# Patient Record
Sex: Female | Born: 1960 | Race: Black or African American | Hispanic: No | Marital: Married | Smoking: Never smoker
Health system: Southern US, Community
[De-identification: ages and names within clinical notes are randomized; demographics above are authoritative.]

## PROBLEM LIST (undated history)

## (undated) DIAGNOSIS — J301 Allergic rhinitis due to pollen: Secondary | ICD-10-CM

## (undated) DIAGNOSIS — R0600 Dyspnea, unspecified: Secondary | ICD-10-CM

## (undated) DIAGNOSIS — T7840XA Allergy, unspecified, initial encounter: Secondary | ICD-10-CM

## (undated) DIAGNOSIS — K219 Gastro-esophageal reflux disease without esophagitis: Secondary | ICD-10-CM

## (undated) DIAGNOSIS — M1990 Unspecified osteoarthritis, unspecified site: Secondary | ICD-10-CM

## (undated) DIAGNOSIS — D649 Anemia, unspecified: Secondary | ICD-10-CM

## (undated) DIAGNOSIS — J45909 Unspecified asthma, uncomplicated: Secondary | ICD-10-CM

## (undated) DIAGNOSIS — R7303 Prediabetes: Secondary | ICD-10-CM

## (undated) DIAGNOSIS — I209 Angina pectoris, unspecified: Secondary | ICD-10-CM

## (undated) HISTORY — DX: Gastro-esophageal reflux disease without esophagitis: K21.9

## (undated) HISTORY — DX: Allergic rhinitis due to pollen: J30.1

## (undated) HISTORY — DX: Allergy, unspecified, initial encounter: T78.40XA

## (undated) HISTORY — PX: NO PAST SURGERIES: SHX2092

## (undated) HISTORY — DX: Anemia, unspecified: D64.9

## (undated) SURGERY — Surgical Case
Anesthesia: *Unknown

---

## 2015-07-19 ENCOUNTER — Emergency Department (HOSPITAL_COMMUNITY)

## 2015-07-19 ENCOUNTER — Encounter (HOSPITAL_COMMUNITY): Payer: Self-pay | Admitting: Family Medicine

## 2015-07-19 ENCOUNTER — Emergency Department (HOSPITAL_COMMUNITY)
Admission: EM | Admit: 2015-07-19 | Discharge: 2015-07-19 | Disposition: A | Discharge status: Home / Self Care | Attending: Emergency Medicine | Admitting: Emergency Medicine

## 2015-07-19 DIAGNOSIS — R42 Dizziness and giddiness: Secondary | ICD-10-CM | POA: Insufficient documentation

## 2015-07-19 DIAGNOSIS — X58XXXA Exposure to other specified factors, initial encounter: Secondary | ICD-10-CM | POA: Insufficient documentation

## 2015-07-19 DIAGNOSIS — Z91040 Latex allergy status: Secondary | ICD-10-CM | POA: Insufficient documentation

## 2015-07-19 DIAGNOSIS — J45909 Unspecified asthma, uncomplicated: Secondary | ICD-10-CM | POA: Insufficient documentation

## 2015-07-19 DIAGNOSIS — S43142A Inferior dislocation of left acromioclavicular joint, initial encounter: Secondary | ICD-10-CM | POA: Insufficient documentation

## 2015-07-19 DIAGNOSIS — Y9389 Activity, other specified: Secondary | ICD-10-CM | POA: Insufficient documentation

## 2015-07-19 DIAGNOSIS — M7522 Bicipital tendinitis, left shoulder: Secondary | ICD-10-CM | POA: Insufficient documentation

## 2015-07-19 DIAGNOSIS — Y9289 Other specified places as the place of occurrence of the external cause: Secondary | ICD-10-CM | POA: Insufficient documentation

## 2015-07-19 DIAGNOSIS — S43102A Unspecified dislocation of left acromioclavicular joint, initial encounter: Secondary | ICD-10-CM

## 2015-07-19 DIAGNOSIS — Z8719 Personal history of other diseases of the digestive system: Secondary | ICD-10-CM | POA: Insufficient documentation

## 2015-07-19 DIAGNOSIS — M25512 Pain in left shoulder: Secondary | ICD-10-CM

## 2015-07-19 DIAGNOSIS — Y999 Unspecified external cause status: Secondary | ICD-10-CM | POA: Insufficient documentation

## 2015-07-19 DIAGNOSIS — Z862 Personal history of diseases of the blood and blood-forming organs and certain disorders involving the immune mechanism: Secondary | ICD-10-CM | POA: Diagnosis not present

## 2015-07-19 DIAGNOSIS — R635 Abnormal weight gain: Secondary | ICD-10-CM | POA: Diagnosis not present

## 2015-07-19 DIAGNOSIS — Z3202 Encounter for pregnancy test, result negative: Secondary | ICD-10-CM | POA: Insufficient documentation

## 2015-07-19 DIAGNOSIS — N951 Menopausal and female climacteric states: Secondary | ICD-10-CM

## 2015-07-19 DIAGNOSIS — N958 Other specified menopausal and perimenopausal disorders: Secondary | ICD-10-CM | POA: Insufficient documentation

## 2015-07-19 DIAGNOSIS — R232 Flushing: Secondary | ICD-10-CM

## 2015-07-19 HISTORY — DX: Unspecified asthma, uncomplicated: J45.909

## 2015-07-19 LAB — URINALYSIS, ROUTINE W REFLEX MICROSCOPIC
BILIRUBIN URINE: NEGATIVE
GLUCOSE, UA: NEGATIVE mg/dL
Hgb urine dipstick: NEGATIVE
KETONES UR: NEGATIVE mg/dL
NITRITE: NEGATIVE
PH: 6.5 (ref 5.0–8.0)
Protein, ur: NEGATIVE mg/dL
Specific Gravity, Urine: 1.024 (ref 1.005–1.030)

## 2015-07-19 LAB — URINE MICROSCOPIC-ADD ON: RBC / HPF: NONE SEEN RBC/hpf (ref 0–5)

## 2015-07-19 LAB — CBC
HCT: 38.1 % (ref 36.0–46.0)
Hemoglobin: 12 g/dL (ref 12.0–15.0)
MCH: 25.1 pg — AB (ref 26.0–34.0)
MCHC: 31.5 g/dL (ref 30.0–36.0)
MCV: 79.7 fL (ref 78.0–100.0)
PLATELETS: 365 10*3/uL (ref 150–400)
RBC: 4.78 MIL/uL (ref 3.87–5.11)
RDW: 16.9 % — ABNORMAL HIGH (ref 11.5–15.5)
WBC: 9.3 10*3/uL (ref 4.0–10.5)

## 2015-07-19 LAB — POC URINE PREG, ED: Preg Test, Ur: NEGATIVE

## 2015-07-19 LAB — BASIC METABOLIC PANEL
Anion gap: 8 (ref 5–15)
BUN: 15 mg/dL (ref 6–20)
CALCIUM: 9.7 mg/dL (ref 8.9–10.3)
CO2: 27 mmol/L (ref 22–32)
CREATININE: 0.96 mg/dL (ref 0.44–1.00)
Chloride: 106 mmol/L (ref 101–111)
GFR calc non Af Amer: >60 mL/min (ref >60)
Glucose, Bld: 121 mg/dL — ABNORMAL HIGH (ref 65–99)
Potassium: 3.7 mmol/L (ref 3.5–5.1)
SODIUM: 141 mmol/L (ref 135–145)

## 2015-07-19 LAB — I-STAT TROPONIN, ED: TROPONIN I, POC: 0 ng/mL (ref 0.00–0.08)

## 2015-07-19 MED ORDER — HYDROCODONE-ACETAMINOPHEN 5-325 MG PO TABS: 1.0000 | ORAL_TABLET | Freq: Four times a day (QID) | ORAL | Status: DC | PRN

## 2015-07-19 MED ORDER — MELOXICAM 15 MG PO TABS: 15.0000 mg | ORAL_TABLET | Freq: Every day | ORAL | Status: DC

## 2015-07-19 MED ORDER — KETOROLAC TROMETHAMINE 30 MG/ML IJ SOLN
30.0000 mg | Freq: Once | INTRAMUSCULAR | Status: AC
  Administered 2015-07-19: 30 mg via INTRAMUSCULAR
  Filled 2015-07-19: qty 1

## 2015-07-19 NOTE — ED Provider Notes (Signed)
CSN: [id number]     Arrival date & time 07/19/15  1823 History   First MD Initiated Contact with Patient 07/19/15 2100     Chief Complaint  Patient presents with  . Arm Pain  . Dizziness     (Consider location/radiation/quality/duration/timing/severity/associated sxs/prior Treatment) HPI Comments: Lisa Galloway is a 55 y.o. female with a PMHx of asthma, GERD, and anemia, who presents to the ED with multiple complaints. Her primary complaint is episodes of lightheadedness when she stands which occur intermittently, are brief lasting only approximately 15 seconds or less, improved with sitting down and resting, and with no other treatments tried prior to arrival. They've been ongoing for approximately 3 weeks. She also complains of left upper arm pain 3 weeks, describing it as 9/10 aching in the bicep region, intermittent, radiating down her arm, worse with movement and sleeping on her left side, and improved mildly with Aleve, tramadol, Vicodin, ibuprofen, and Tylenol. Lastly she is concerned because she has gained 35 pounds in the last 4 months and she is not sure why. She has been having hot flashes and has not had a menstrual cycle in 4 months, she's not sure whether she may be going through menopause. Additionally she reports intermittent nausea which is currently resolved. She does not have a regular doctor. She is left-handed and states that she does some repetitive motions with her left arm. She denies any injuries to the left arm.  She denies any fevers, chills, vision changes, headache, syncope, vertiginous dizziness, chest pain, shortness of breath, abdominal pain, vomiting, diarrhea, constipation, melena, hematochezia, dysuria, hematuria, vaginal bleeding or discharge, diaphoresis, numbness, tingling, weakness, leg swelling, personal or family history of DVT/PE, skin changes to the left arm, joint swelling, estrogen use, recent travel/surgery/immobilization, or any other associated  symptoms. She is a nonsmoker.   Patient is a 55 y.o. female presenting with arm pain and dizziness. The history is provided by the patient. No language interpreter was used.  Arm Pain This is a new problem. The current episode started 1 to 4 weeks ago. The problem occurs intermittently. The problem has been unchanged. Associated symptoms include arthralgias (L shoulder/arm). Pertinent negatives include no abdominal pain, chest pain, chills, diaphoresis, fever, headaches, joint swelling, myalgias, nausea (intermittent, currently resolved), neck pain, numbness, urinary symptoms, vertigo, visual change, vomiting or weakness. Exacerbated by: arm movement and sleeping on it. She has tried acetaminophen, NSAIDs and oral narcotics for the symptoms. The treatment provided mild relief.  Dizziness Associated symptoms: no blood in stool, no chest pain, no diarrhea, no headaches, no nausea (intermittent, currently resolved), no shortness of breath, no vision changes, no vomiting and no weakness     Past Medical History  Diagnosis Date  . Asthma    History reviewed. No pertinent past surgical history. History reviewed. No pertinent family history. Social History  Substance Use Topics  . Smoking status: Never Smoker   . Smokeless tobacco: None  . Alcohol Use: No   OB History    No data available     Review of Systems  Constitutional: Positive for unexpected weight change (35lb gain in 4 months). Negative for fever, chills and diaphoresis.  Eyes: Negative for visual disturbance.  Respiratory: Negative for shortness of breath.   Cardiovascular: Negative for chest pain and leg swelling.  Gastrointestinal: Negative for nausea (intermittent, currently resolved), vomiting, abdominal pain, diarrhea, constipation and blood in stool.  Genitourinary: Positive for menstrual problem (no menses in 4 months). Negative for dysuria, hematuria, vaginal bleeding  and vaginal discharge.  Musculoskeletal: Positive for  arthralgias (L shoulder/arm). Negative for myalgias, joint swelling and neck pain.  Skin: Negative for color change.  Allergic/Immunologic: Negative for immunocompromised state.  Neurological: Positive for dizziness and light-headedness. Negative for vertigo, syncope, weakness, numbness and headaches.  Psychiatric/Behavioral: Negative for confusion.   10 Systems reviewed and are negative for acute change except as noted in the HPI.    Allergies  Ginger; Latex; and Oxycodone  Home Medications   Prior to Admission medications   Not on File   BP 125/89 mmHg  Pulse 95  Temp(Src) 98.2 F (36.8 C) (Oral)  Resp 18  Wt 81.647 kg  SpO2 100% Physical Exam  Constitutional: She is oriented to person, place, and time. Vital signs are normal. She appears well-developed and well-nourished.  Non-toxic appearance. No distress.  Afebrile, nontoxic, NAD  HENT:  Head: Normocephalic and atraumatic.  Mouth/Throat: Oropharynx is clear and moist and mucous membranes are normal.  Eyes: Conjunctivae and EOM are normal. Pupils are equal, round, and reactive to light. Right eye exhibits no discharge. Left eye exhibits no discharge.  PERRL, EOMI, no nystagmus, no visual field deficits   Neck: Normal range of motion. Neck supple. No spinous process tenderness and no muscular tenderness present. No rigidity. Normal range of motion present.  FROM intact without spinous process TTP, no bony stepoffs or deformities, no paraspinous muscle TTP or muscle spasms. No rigidity or meningeal signs. No bruising or swelling.   Cardiovascular: Normal rate, regular rhythm, normal heart sounds and intact distal pulses.  Exam reveals no gallop and no friction rub.   No murmur heard. RRR, nl s1/s2, no m/r/g, distal pulses intact, no pedal edema   Pulmonary/Chest: Effort normal and breath sounds normal. No respiratory distress. She has no decreased breath sounds. She has no wheezes. She has no rhonchi. She has no rales.    Abdominal: Soft. Normal appearance and bowel sounds are normal. She exhibits no distension. There is no tenderness. There is no rigidity, no rebound, no guarding, no CVA tenderness, no tenderness at McBurney's point and negative Murphy's sign.  Musculoskeletal: Normal range of motion.       Left shoulder: She exhibits tenderness. She exhibits normal range of motion, no bony tenderness, no swelling, no effusion, no crepitus, no deformity, no spasm, normal pulse and normal strength.       Arms: MAE x4 Strength and sensation grossly intact Distal pulses intact Gait steady No pedal edema, neg homan's bilaterally No swelling in any extremity L shoulder with FROM intact, no focal bony TTP, with mild TTP at bicipital groove and into biceps, no muscular spasms, no swelling/effusion, no bruising or erythema, no warmth, no crepitus/deformity, negative apley scratch, neg pain with resisted int/ext rotation. Compartments soft.  Neurological: She is alert and oriented to person, place, and time. She has normal strength. No cranial nerve deficit or sensory deficit. Coordination and gait normal. GCS eye subscore is 4. GCS verbal subscore is 5. GCS motor subscore is 6.  CN 2-12 grossly intact A&O x4 GCS 15 Sensation and strength intact Gait nonataxic Coordination with finger-to-nose WNL Neg pronator drift   Skin: Skin is warm, dry and intact. No rash noted.  Psychiatric: She has a normal mood and affect.  Nursing note and vitals reviewed.   ED Course  Procedures (including critical care time)  21:29:45 Orthostatic Vital Signs GS  Orthostatic Lying  - BP- Lying: 134/104 mmHg ; Pulse- Lying: 84  Orthostatic Sitting - BP- Sitting:  131/99 mmHg ; Pulse- Sitting: 77  Orthostatic Standing at 0 minutes - BP- Standing at 0 minutes: 129/100 mmHg ; Pulse- Standing at 0 minutes: 99      Labs Review Labs Reviewed  BASIC METABOLIC PANEL - Abnormal; Notable for the following:    Glucose, Bld 121 (*)    All  other components within normal limits  CBC - Abnormal; Notable for the following:    MCH 25.1 (*)    RDW 16.9 (*)    All other components within normal limits  URINALYSIS, ROUTINE W REFLEX MICROSCOPIC (NOT AT [facility]) - Abnormal; Notable for the following:    Leukocytes, UA SMALL (*)    All other components within normal limits  URINE MICROSCOPIC-ADD ON - Abnormal; Notable for the following:    Squamous Epithelial / LPF 0-5 (*)    Bacteria, UA RARE (*)    All other components within normal limits  I-STAT TROPOININ, ED  POC URINE PREG, ED    Imaging Review Dg Shoulder Left  07/19/2015  CLINICAL DATA:  Left shoulder pain for the past 3 weeks. No known injury. EXAM: LEFT SHOULDER - 2+ VIEW COMPARISON:  None. FINDINGS: Superior subluxation of the distal clavicle relative to the acromion. Mild to moderate inferior acromioclavicular spur formation. No visible fracture. IMPRESSION: Superior subluxation of the distal clavicle relative to the acromion with mild to moderate inferior acromioclavicular spur formation. Electronically Signed   By: Claudie Revering M.D.   On: 07/19/2015 22:07   I have personally reviewed and evaluated these images and lab results as part of my medical decision-making.   EKG Interpretation   Date/Time:  Friday July 19 2015 18:37:20 EST Ventricular Rate:  85 PR Interval:  182 QRS Duration: 76 QT Interval:  360 QTC Calculation: 428 R Axis:   -16 Text Interpretation:  Normal sinus rhythm Non-specific ST-t changes  Confirmed by Wilson Singer  MD, STEPHEN ([id number]) on 07/19/2015 10:45:21 PM      MDM   Final diagnoses:  Orthostatic lightheadedness  Weight gain  Left shoulder pain  Bicipital tendinitis of left shoulder  Acromioclavicular joint separation, left, initial encounter  Hot flashes  Menopause syndrome    55 y.o. female here with lightheadedness with standing intermittently x3wks, with L upper arm pain x3 wks, and concerns of wt gain (35lbs in 4 months). On exam,  extremities NVI with soft compartments, no pitting edema in all extremities, with tenderness at L bicipital groove and into biceps consistent with bicipital tendinitis (pt L handed and doing repetitive motions). Pt requesting xray imaging, will obtain to eval for arthritis or other etiology but no trauma prior to onset of symptoms. States she's nauseated intermittently which is resolved. Having hot flashes and no menses in 4 months, will ensure no possible pregnancy, but discussed with pt that lightheadedness/hot flashes/etc could be early menopause symptoms. No focal neuro deficits on exam, doubt need for head imaging, will get orthostatic VS but given that this is an intermittent nonacute issue doubt need for further emergent work up at this time. EKG unremarkable (some nonspecific ST-T changes but no TWI or Q waves, no other ischemic changes), trop neg, CBC WNL, BMP with mildly elevated gluc at 121 but otherwise WNL. No hypoxia or tachycardia, no LE swelling, no RFs for DVT/PE, doubt either of these as an etiology. Will give Toradol IM, get L shoulder xray, U/A and Upreg, and orthostatic VS, then reassess shortly.   10:54 PM  Pain mildly improved after toradol. Orthostatic VS reveal  orthostatic tachycardia but no issues with BP during orthostatic VS. This could still indicate some slight dehydration as a cause to her lightheadedness. Doubt other neurologic or cardiac etiologies, doubt emergent etiology that needs further work up today. Upreg neg. U/A unremarkable. Xray showing AC separation with superior subluxation of distal clavicle relative to acromion with slight AC spur formation, no visible fx. Doubt this is acute, given that she has had no trauma to the arm/shoulder, likely from [facility] joint instability. Will start on mobic, give sling immobilizer and have her f/up with ortho, discussed ice/heat use. Will give pain meds for home as well. Also discussed importance of staying hydrated. Discussed f/up with  [facility] for ongoing medical care and evaluation of her other chronic issues/concerns. Doubt need for further work up today. I explained the diagnosis and have given explicit precautions to return to the ER including for any other new or worsening symptoms. The patient understands and accepts the medical plan as it's been dictated and I have answered their questions. Discharge instructions concerning home care and prescriptions have been given. The patient is STABLE and is discharged to home in good condition.  BP 125/89 mmHg  Pulse 95  Temp(Src) 98.2 F (36.8 C) (Oral)  Resp 18  Wt 81.647 kg  SpO2 100%  Meds ordered this encounter  Medications  . ketorolac (TORADOL) 30 MG/ML injection 30 mg    Sig:   . meloxicam (MOBIC) 15 MG tablet    Sig: Take 1 tablet (15 mg total) by mouth daily.    Dispense:  30 tablet    Refill:  0    Order Specific Question:  Supervising Provider    Answer:  MILLER, BRIAN [[id number]]  . HYDROcodone-acetaminophen (NORCO) 5-325 MG tablet    Sig: Take 1 tablet by mouth every 6 (six) hours as needed for severe pain.    Dispense:  15 tablet    Refill:  0    Order Specific Question:  Supervising Provider    Answer:  Noemi Chapel [[id number]]     Lisa Sawyer Camprubi-Soms, PA-C 07/19/15 2256  Virgel Manifold, MD 07/20/15 [phone]

## 2015-07-19 NOTE — Discharge Instructions (Signed)
Wear shoulder sling at all times until you see the orthopedist, performing gentle range of motion exercises once daily to keep the arm from getting stiff. Use ice or heat to your shoulder throughout the day, using an ice pack/heat pad for 20 minutes at a time every hour. Use mobic daily as directed which is for inflammation, and use norco as directed as needed for additional pain relief. Do not drive or operate machinery with norco use. Call orthopedic follow up today or tomorrow to schedule followup appointment in 1 week for ongoing management of your shoulder pain/AC joint separation. Your lightheadedness could be due to dehydration, stay well hydrated. Follow up with [facility] in 1-2 weeks for ongoing evaluation of your symptoms and to establish medical care, and to discuss your weight gain. Return to the ER for changes or worsening symptoms.    Shoulder Separation A shoulder separation (acromioclavicular separation) is an injury to the connecting tissue (ligament) between the top of your shoulder blade (acromion) and your collarbone (clavicle). The ligament may be stretched, partially torn, or completely torn.  A stretched ligament may not cause very much pain, and it does not move the collarbone out of place. A stretched ligament looks normal on an X-ray.  An injury that is a bit worse may partially tear a ligament and move the collarbone slightly out of place.  A serious injury completely tears both shoulder ligaments. This moves the collarbone severely out of position and changes the way that the shoulder looks (deformity). CAUSES The most common cause of a shoulder separation is falling on or receiving a blow to the top of the shoulder. Falling with an outstretched arm may also cause this injury. RISK FACTORS You may be at greater risk of a shoulder separation if:  You are female.  You are younger than age 72.  You play a contact sport, such as football or hockey. SIGNS  AND SYMPTOMS The most common symptom of a shoulder separation is pain on the top of the shoulder after falling on it or receiving a blow to it. Other signs and symptoms include:  Shoulder deformity.  Swelling of the shoulder.  Decreased ability to move the shoulder.  Bruising on top of the shoulder. DIAGNOSIS Your health care provider may suspect a shoulder separation based on your symptoms and the details of a recent injury. A physical exam will be done. During this exam, the health care provider may:  Press on your shoulder.  Test the movement of your shoulder.  Ask you to hold a weight in your hand to see if the separation increases.  Do an X-ray. TREATMENT  A stretch injury may require only a sling, pain medicine, and cold packs. This treatment may last for 2-12 weeks. You may also have physical therapy. A physical therapist will teach you to do daily exercises to strengthen your shoulder muscles and prevent stiffness.  A complete tear may require surgery to repair the torn ligament. After surgery, you will also require a sling, pain medicine, and cold packs. Recovery may take longer. You may also need more physical therapy. HOME CARE INSTRUCTIONS  Take medicines only as directed by your health care provider.  Apply ice to the top of your shoulder:  Put ice in a plastic bag.  Place a towel between your skin and the bag.  Leave the ice on for 20 minutes, 2-3 times a day.  Wear your sling or splint as directed by your health care provider.  You may be able to remove your sling to do your physical therapy exercises.  Ask your health care provider when you can stop wearing the sling.  Do not do any activities that make your pain worse.  Do not lift anything that is heavier than 10 lb (4.5 kg) on the injured side of your body.  Ask your health care provider when you can return to athletic activities. SEEK MEDICAL CARE IF:  Your pain medicine is not relieving your  pain.  Your pain and stiffness are not improving after 2 weeks.  You are unable to do your physical therapy exercises because of pain or stiffness.   This information is not intended to replace advice given to you by your health care provider. Make sure you discuss any questions you have with your health care provider.   Document Released: 02/25/2005 Document Revised: 06/08/2014 Document Reviewed: 10/18/2013 Elsevier Interactive Patient Education 2016 [employer]  Shoulder Range of Motion Exercises Shoulder range of motion (ROM) exercises are designed to keep the shoulder moving freely. They are often recommended for people who have shoulder pain. MOVEMENT EXERCISE When you are able, do this exercise 5-6 days per week, or as told by your health care provider. Work toward doing 2 sets of 10 swings. Pendulum Exercise How To Do This Exercise Lying Down  Lie face-down on a bed with your abdomen close to the side of the bed.  Let your arm hang over the side of the bed.  Relax your shoulder, arm, and hand.  Slowly and gently swing your arm forward and back. Do not use your neck muscles to swing your arm. They should be relaxed. If you are struggling to swing your arm, have someone gently swing it for you. When you do this exercise for the first time, swing your arm at a 15 degree angle for 15 seconds, or swing your arm 10 times. As pain lessens over time, increase the angle of the swing to 30-45 degrees.  Repeat steps 1-4 with the other arm. How To Do This Exercise While Standing  Stand next to a sturdy chair or table and hold on to it with your hand.  Bend forward at the waist.  Bend your knees slightly.  Relax your other arm and let it hang limp.  Relax the shoulder blade of the arm that is hanging and let it drop.  While keeping your shoulder relaxed, use body motion to swing your arm in small circles. The first time you do this exercise, swing your arm for about 30 seconds or  10 times. When you do it next time, swing your arm for a little longer.  Stand up tall and relax.  Repeat steps 1-7, this time changing the direction of the circles.  Repeat steps 1-8 with the other arm. STRETCHING EXERCISES Do these exercises 3-4 times per day on 5-6 days per week or as told by your health care provider. Work toward holding the stretch for 20 seconds. Stretching Exercise 1  Lift your arm straight out in front of you.  Bend your arm 90 degrees at the elbow (right angle) so your forearm goes across your body and looks like the letter "L."  Use your other arm to gently pull the elbow forward and across your body.  Repeat steps 1-3 with the other arm. Stretching Exercise 2 You will need a towel or rope for this exercise.  Bend one arm behind your back with the palm facing outward.  Hold a  towel with your other hand.  Reach the arm that holds the towel above your head, and bend that arm at the elbow. Your wrist should be behind your neck.  Use your free hand to grab the free end of the towel.  With the higher hand, gently pull the towel up behind you.  With the lower hand, pull the towel down behind you.  Repeat steps 1-6 with the other arm. STRENGTHENING EXERCISES Do each of these exercises at four different times of day (sessions) every day or as told by your health care provider. To begin with, repeat each exercise 5 times (repetitions). Work toward doing 3 sets of 12 repetitions or as told by your health care provider. Strengthening Exercise 1 You will need a light weight for this activity. As you grow stronger, you may use a heavier weight.  Standing with a weight in your hand, lift your arm straight out to the side until it is at the same height as your shoulder.  Bend your arm at 90 degrees so that your fingers are pointing to the ceiling.  Slowly raise your hand until your arm is straight up in the air.  Repeat steps 1-3 with the other  arm. Strengthening Exercise 2 You will need a light weight for this activity. As you grow stronger, you may use a heavier weight.  Standing with a weight in your hand, gradually move your straight arm in an arc, starting at your side, then out in front of you, then straight up over your head.  Gradually move your other arm in an arc, starting at your side, then out in front of you, then straight up over your head.  Repeat steps 1-2 with the other arm. Strengthening Exercise 3 You will need an elastic band for this activity. As you grow stronger, gradually increase the size of the bands or increase the number of bands that you use at one time.  While standing, hold an elastic band in one hand and raise that arm up in the air.  With your other hand, pull down the band until that hand is by your side.  Repeat steps 1-2 with the other arm.   This information is not intended to replace advice given to you by your health care provider. Make sure you discuss any questions you have with your health care provider.   Document Released: 02/14/2003 Document Revised: 10/02/2014 Document Reviewed: 05/14/2014 Elsevier Interactive Patient Education 2016 [employer]  Bicipital Tendonitis Bicipital tendonitis refers to redness, soreness, and swelling (inflammation) or irritation of the bicep tendon. The biceps muscle is located between the elbow and shoulder of the inner arm. The tendon heads, similar to pieces of rope, connect the bicep muscle to the shoulder socket. They are called short head and long head tendons. When tendonitis occurs, the long head tendon is inflamed and swollen, and may be thickened or partially torn.  Bicipital tendonitis can occur with other problems as well, such as arthritis in the shoulder or acromioclavicular joints, tears in the tendons, or other rotator cuff problems.  CAUSES  Overuse of of the arms for overhead activities is the major cause of tendonitis. Many athletes,  such as swimmers, baseball players, and tennis players are prone to bicipital tendonitis. Jobs that require manual labor or routine chores, especially chores involving overhead activities can result in overuse and tendonitis. SYMPTOMS Symptoms may include:  Pain in and around the front of the shoulder. Pain may be worse with overhead motion.  Pain or aching that radiates down the arm.  Clicking or shifting sensations in the shoulder. DIAGNOSIS Your caregiver may perform the following:  Physical exam and tests of the biceps and shoulder to observe range of motion, strength, and stability.  X-rays or magnetic resonance imaging (MRI) to confirm the diagnosis. In most common cases, these tests are not necessary. Since other problems may exist in the shoulder or rotator cuff, additional tests may be recommended. TREATMENT Treatment may include the following:  Medications  Your caregiver may prescribe over-the-counter pain relievers.  Steroid injections, such as cortisone, may be recommended. These may help to reduce inflammation and pain.  Physical Therapy - Your caregiver may recommend gentle exercises with the arm. These can help restore strength and range of motion. They may be done at home or with a physical therapist's supervision and input.  Surgery - Arthroscopic or open surgery sometimes is necessary. Surgery may include:  Reattachment or repair of the tendon at the shoulder socket.  Removal of the damaged section of the tendon.  Anchoring the tendon to a different area of the shoulder (tenodesis). HOME CARE INSTRUCTIONS   Avoid overhead motion of the affected arm or any other motion that causes pain.  Take medication for pain as directed. Do not take these for more than 3 weeks, unless directed to do so by your caregiver.  Ice the affected area for 20 minutes at a time, 3-4 times per day. Place a towel on the skin over the painful area and the ice or cold pack over the  towel. Do not place ice directly on the skin.  Perform gentle exercises at home as directed. These will increase strength and flexibility. PREVENTION  Modify your activities as much as possible to protect your arm. A physical therapist or sports medicine physician can help you understand options for safe motion.  Avoid repetitive overhead pulling, lifting, reaching, and throwing until your caregiver tells you it is ok to resume these activities. SEEK MEDICAL CARE IF:  Your pain worsens.  You have difficulty moving the affected arm.  You have trouble performing any of the self-care instructions. MAKE SURE YOU:   Understand these instructions.  Will watch your condition.  Will get help right away if you are not doing well or get worse.   This information is not intended to replace advice given to you by your health care provider. Make sure you discuss any questions you have with your health care provider.   Document Released: 06/20/2010 Document Revised: 08/10/2011 Document Reviewed: 12/05/2014 Elsevier Interactive Patient Education 2016 [employer] Inc.  Cryotherapy Cryotherapy is when you put ice on your injury. Ice helps lessen pain and puffiness (swelling) after an injury. Ice works the best when you start using it in the first 24 to 48 hours after an injury. HOME CARE  Put a dry or damp towel between the ice pack and your skin.  You may press gently on the ice pack.  Leave the ice on for no more than 10 to 20 minutes at a time.  Check your skin after 5 minutes to make sure your skin is okay.  Rest at least 20 minutes between ice pack uses.  Stop using ice when your skin loses feeling (numbness).  Do not use ice on someone who cannot tell you when it hurts. This includes small children and people with memory problems (dementia). GET HELP RIGHT AWAY IF:  You have white spots on your skin.  Your skin turns blue  or pale.  Your skin feels waxy or hard.  Your puffiness  gets worse. MAKE SURE YOU:   Understand these instructions.  Will watch your condition.  Will get help right away if you are not doing well or get worse.   This information is not intended to replace advice given to you by your health care provider. Make sure you discuss any questions you have with your health care provider.   Document Released: 11/04/2007 Document Revised: 08/10/2011 Document Reviewed: 01/08/2011 Elsevier Interactive Patient Education 2016 [place] therapy can help ease sore, stiff, injured, and tight muscles and joints. Heat relaxes your muscles, which may help ease your pain.  RISKS AND COMPLICATIONS If you have any of the following conditions, do not use heat therapy unless your health care provider has approved:  Poor circulation.  Healing wounds or scarred skin in the area being treated.  Diabetes, heart disease, or high blood pressure.  Not being able to feel (numbness) the area being treated.  Unusual swelling of the area being treated.  Active infections.  Blood clots.  Cancer.  Inability to communicate pain. This may include young children and people who have problems with their brain function (dementia).  Pregnancy. Heat therapy should only be used on old, pre-existing, or long-lasting (chronic) injuries. Do not use heat therapy on new injuries unless directed by your health care provider. HOW TO USE HEAT THERAPY There are several different kinds of heat therapy, including:  Moist heat pack.  Warm water bath.  Hot water bottle.  Electric heating pad.  Heated gel pack.  Heated wrap.  Electric heating pad. Use the heat therapy method suggested by your health care provider. Follow your health care provider's instructions on when and how to use heat therapy. GENERAL HEAT THERAPY RECOMMENDATIONS  Do not sleep while using heat therapy. Only use heat therapy while you are awake.  Your skin may turn pink while  using heat therapy. Do not use heat therapy if your skin turns red.  Do not use heat therapy if you have new pain.  High heat or long exposure to heat can cause burns. Be careful when using heat therapy to avoid burning your skin.  Do not use heat therapy on areas of your skin that are already irritated, such as with a rash or sunburn. SEEK MEDICAL CARE IF:  You have blisters, redness, swelling, or numbness.  You have new pain.  Your pain is worse. MAKE SURE YOU:  Understand these instructions.  Will watch your condition.  Will get help right away if you are not doing well or get worse.   This information is not intended to replace advice given to you by your health care provider. Make sure you discuss any questions you have with your health care provider.   Document Released: 08/10/2011 Document Revised: 06/08/2014 Document Reviewed: 07/11/2013 Elsevier Interactive Patient Education 2016 [employer].  Menopause Menopause is the normal time of life when menstrual periods stop completely. Menopause is complete when you have missed 12 consecutive menstrual periods. It usually occurs between the ages of 12 years and 40 years. Very rarely does a woman develop menopause before the age of 35 years. At menopause, your ovaries stop producing the female hormones estrogen and progesterone. This can cause undesirable symptoms and also affect your health. Sometimes the symptoms may occur 4-5 years before the menopause begins. There is no relationship between menopause and:  Oral contraceptives.  Number of children you had.  Race.  The age your menstrual periods started (menarche). Heavy smokers and very thin women may develop menopause earlier in life. CAUSES  The ovaries stop producing the female hormones estrogen and progesterone.  Other causes include:  Surgery to remove both ovaries.  The ovaries stop functioning for no known reason.  Tumors of the pituitary gland in the  brain.  Medical disease that affects the ovaries and hormone production.  Radiation treatment to the abdomen or pelvis.  Chemotherapy that affects the ovaries. SYMPTOMS   Hot flashes.  Night sweats.  Decrease in sex drive.  Vaginal dryness and thinning of the vagina causing painful intercourse.  Dryness of the skin and developing wrinkles.  Headaches.  Tiredness.  Irritability.  Memory problems.  Weight gain.  Bladder infections.  Hair growth of the face and chest.  Infertility. More serious symptoms include:  Loss of bone (osteoporosis) causing breaks (fractures).  Depression.  Hardening and narrowing of the arteries (atherosclerosis) causing heart attacks and strokes. DIAGNOSIS   When the menstrual periods have stopped for 12 straight months.  Physical exam.  Hormone studies of the blood. TREATMENT  There are many treatment choices and nearly as many questions about them. The decisions to treat or not to treat menopausal changes is an individual choice made with your health care provider. Your health care provider can discuss the treatments with you. Together, you can decide which treatment will work best for you. Your treatment choices may include:   Hormone therapy (estrogen and progesterone).  Non-hormonal medicines.  Treating the individual symptoms with medicine (for example antidepressants for depression).  Herbal medicines that may help specific symptoms.  Counseling by a psychiatrist or psychologist.  Group therapy.  Lifestyle changes including:  Eating healthy.  Regular exercise.  Limiting caffeine and alcohol.  Stress management and meditation.  No treatment. HOME CARE INSTRUCTIONS   Take the medicine your health care provider gives you as directed.  Get plenty of sleep and rest.  Exercise regularly.  Eat a diet that contains calcium (good for the bones) and soy products (acts like estrogen hormone).  Avoid alcoholic  beverages.  Do not smoke.  If you have hot flashes, dress in layers.  Take supplements, calcium, and vitamin D to strengthen bones.  You can use over-the-counter lubricants or moisturizers for vaginal dryness.  Group therapy is sometimes very helpful.  Acupuncture may be helpful in some cases. SEEK MEDICAL CARE IF:   You are not sure you are in menopause.  You are having menopausal symptoms and need advice and treatment.  You are still having menstrual periods after age 74 years.  You have pain with intercourse.  Menopause is complete (no menstrual period for 12 months) and you develop vaginal bleeding.  You need a referral to a specialist (gynecologist, psychiatrist, or psychologist) for treatment. SEEK IMMEDIATE MEDICAL CARE IF:   You have severe depression.  You have excessive vaginal bleeding.  You fell and think you have a broken bone.  You have pain when you urinate.  You develop leg or chest pain.  You have a fast pounding heart beat (palpitations).  You have severe headaches.  You develop vision problems.  You feel a lump in your breast.  You have abdominal pain or severe indigestion.   This information is not intended to replace advice given to you by your health care provider. Make sure you discuss any questions you have with your health care provider.   Document Released: 08/08/2003 Document Revised: 01/18/2013 Document  Reviewed: 12/15/2012 Elsevier Interactive Patient Education 2016 Else[employer] Inc.  Dizziness Dizziness is a common problem. It makes you feel unsteady or lightheaded. You may feel like you are about to pass out (faint). Dizziness can lead to injury if you stumble or fall. Anyone can get dizzy, but dizziness is more common in older adults. This condition can be caused by a number of things, including:  Medicines.  Dehydration.  Illness. HOME CARE Following these instructions may help with your condition: Eating and  Drinking  Drink enough fluid to keep your pee (urine) clear or pale yellow. This helps to keep you from getting dehydrated. Try to drink more clear fluids, such as water.  Do not drink alcohol.  Limit how much caffeine you drink or eat if told by your doctor.  Limit how much salt you drink or eat if told by your doctor. Activity  Avoid making quick movements.  When you stand up from sitting in a chair, steady yourself until you feel okay.  In the morning, first sit up on the side of the bed. When you feel okay, stand slowly while you hold onto something. Do this until you know that your balance is fine.  Move your legs often if you need to stand in one place for a long time. Tighten and relax your muscles in your legs while you are standing.  Do not drive or use heavy machinery if you feel dizzy.  Avoid bending down if you feel dizzy. Place items in your home so that they are easy for you to reach without leaning over. Lifestyle  Do not use any tobacco products, including cigarettes, chewing tobacco, or electronic cigarettes. If you need help quitting, ask your doctor.  Try to lower your stress level, such as with yoga or meditation. Talk with your doctor if you need help. General Instructions  Watch your dizziness for any changes.  Take medicines only as told by your doctor. Talk with your doctor if you think that your dizziness is caused by a medicine that you are taking.  Tell a friend or a family member that you are feeling dizzy. If he or she notices any changes in your behavior, have this person call your doctor.  Keep all follow-up visits as told by your doctor. This is important. GET HELP IF:  Your dizziness does not go away.  Your dizziness or light-headedness gets worse.  You feel sick to your stomach (nauseous).  You have trouble hearing.  You have new symptoms.  You are unsteady on your feet or you feel like the room is spinning. GET HELP RIGHT AWAY  IF:  You throw up (vomit) or have diarrhea and are unable to eat or drink anything.  You have trouble:  Talking.  Walking.  Swallowing.  Using your arms, hands, or legs.  You feel generally weak.  You are not thinking clearly or you have trouble forming sentences. It may take a friend or family member to notice this.  You have:  Chest pain.  Pain in your belly (abdomen).  Shortness of breath.  Sweating.  Your vision changes.  You are bleeding.  You have a headache.  You have neck pain or a stiff neck.  You have a fever.   This information is not intended to replace advice given to you by your health care provider. Make sure you discuss any questions you have with your health care provider.   Document Released: 05/07/2011 Document Revised: 10/02/2014 Document Reviewed: 05/14/2014 Elsevier  Interactive Patient Education ©2016 Elsevier Inc. ° °

## 2015-07-19 NOTE — ED Notes (Signed)
Pt here with dizzy spells, left  arm pain, and weight gain. sts for a few weeks.

## 2015-08-01 ENCOUNTER — Other Ambulatory Visit: Payer: Self-pay | Admitting: Surgical

## 2015-08-05 ENCOUNTER — Ambulatory Visit: Attending: Internal Medicine | Admitting: Internal Medicine

## 2015-08-05 ENCOUNTER — Encounter: Payer: Self-pay | Admitting: Internal Medicine

## 2015-08-05 VITALS — BP 131/86 | HR 80 | Temp 98.2°F | Resp 15 | Ht 65.0 in | Wt 216.2 lb

## 2015-08-05 DIAGNOSIS — L219 Seborrheic dermatitis, unspecified: Secondary | ICD-10-CM | POA: Insufficient documentation

## 2015-08-05 DIAGNOSIS — R7303 Prediabetes: Secondary | ICD-10-CM | POA: Diagnosis not present

## 2015-08-05 DIAGNOSIS — N951 Menopausal and female climacteric states: Secondary | ICD-10-CM

## 2015-08-05 DIAGNOSIS — D259 Leiomyoma of uterus, unspecified: Secondary | ICD-10-CM | POA: Diagnosis not present

## 2015-08-05 DIAGNOSIS — Z79899 Other long term (current) drug therapy: Secondary | ICD-10-CM | POA: Diagnosis not present

## 2015-08-05 DIAGNOSIS — R635 Abnormal weight gain: Secondary | ICD-10-CM

## 2015-08-05 DIAGNOSIS — Z7984 Long term (current) use of oral hypoglycemic drugs: Secondary | ICD-10-CM | POA: Insufficient documentation

## 2015-08-05 DIAGNOSIS — J45909 Unspecified asthma, uncomplicated: Secondary | ICD-10-CM | POA: Insufficient documentation

## 2015-08-05 DIAGNOSIS — O019 Hydatidiform mole, unspecified: Secondary | ICD-10-CM

## 2015-08-05 DIAGNOSIS — Z888 Allergy status to other drugs, medicaments and biological substances status: Secondary | ICD-10-CM | POA: Diagnosis not present

## 2015-08-05 DIAGNOSIS — R42 Dizziness and giddiness: Secondary | ICD-10-CM | POA: Diagnosis present

## 2015-08-05 DIAGNOSIS — Z131 Encounter for screening for diabetes mellitus: Secondary | ICD-10-CM

## 2015-08-05 DIAGNOSIS — M25512 Pain in left shoulder: Secondary | ICD-10-CM | POA: Diagnosis not present

## 2015-08-05 DIAGNOSIS — M7522 Bicipital tendinitis, left shoulder: Secondary | ICD-10-CM | POA: Diagnosis not present

## 2015-08-05 DIAGNOSIS — B372 Candidiasis of skin and nail: Secondary | ICD-10-CM | POA: Diagnosis not present

## 2015-08-05 LAB — POCT GLYCOSYLATED HEMOGLOBIN (HGB A1C): Hemoglobin A1C: 6.4

## 2015-08-05 MED ORDER — METFORMIN HCL 500 MG PO TABS: 500.0000 mg | ORAL_TABLET | Freq: Two times a day (BID) | ORAL | Status: DC

## 2015-08-05 MED ORDER — FLUCONAZOLE 150 MG PO TABS: 150.0000 mg | ORAL_TABLET | Freq: Every day | ORAL | Status: DC

## 2015-08-05 MED ORDER — NYSTATIN 100000 UNIT/GM EX POWD: 1.0000 | Freq: Three times a day (TID) | CUTANEOUS | Status: DC

## 2015-08-05 MED FILL — NYSTOP 100,000 UNITS/GM PWD: 100000 | 15 days supply | Qty: 60 | Fill #0

## 2015-08-05 MED FILL — FLUCONAZOLE 150 MG TABLET: 150 | 3 days supply | Qty: 3 | Fill #0

## 2015-08-05 MED FILL — ?METFORMIN HCL 500MG TABLET: 500 | 30 days supply | Qty: 60 | Fill #0

## 2015-08-05 NOTE — Progress Notes (Signed)
Lisa Galloway, is a 55 y.o. female  [id number]  [id number]  DOB - 1960/07/16  CC: fu from ER    2/17 for left shoulder pain/lightheadedness  HPI: Lisa Galloway is a 55 y.o. female here today to establish medical care after recent ER visit for left shoulder pain and lightheadedness.  She was dx w/ orthostatic lightheadedness, left shoulder pain possibly due to bicipital tendinitis and AC joint separation. She was given  A toradol shot injection, and prescribed meloxicam and norco.  Stopped taking Norco after dose.  Saw ortho on Friday, and concerned for rotator cuff tear. Has mri set up for 3/11.    Patient has No headache, No chest pain, No abdominal pain - No Nausea, No new weakness tingling or numbness, No Cough - SOB.  States new mole on back, very itchy.  Also co weight gain, 40 lbs last few months. However, she told me she just came from [employer] and ate a breakfast muffin.    Allergies  Allergen Reactions  . Ginger Hives    Possible reaction to ginger root  . Latex Itching  . Oxycodone Nausea And Vomiting and Other (See Comments)    Heart racing, flushed, "felt like a heart attack"   Past Medical History  Diagnosis Date  . Asthma    Current Outpatient Prescriptions on File Prior to Visit  Medication Sig Dispense Refill  . albuterol (PROAIR HFA) 108 (90 Base) MCG/ACT inhaler Inhale 2 puffs into the lungs every 6 (six) hours as needed for wheezing or shortness of breath.    . ferrous sulfate 325 (65 FE) MG tablet Take 325 mg by mouth daily with breakfast.    . HYDROcodone-acetaminophen (NORCO) 5-325 MG tablet Take 1 tablet by mouth every 6 (six) hours as needed for severe pain. 15 tablet 0  . meloxicam (MOBIC) 15 MG tablet Take 1 tablet (15 mg total) by mouth daily. 30 tablet 0  . Multiple Vitamin (MULTIVITAMIN WITH MINERALS) TABS tablet Take 1 tablet by mouth daily.    . naproxen sodium (ALEVE) 220 MG tablet Take 440 mg by mouth daily as needed (pain).    Marland Kitchen  omeprazole (PRILOSEC OTC) 20 MG tablet Take 20 mg by mouth 2 (two) times daily as needed (acid reflux).    . traMADol (ULTRAM) 50 MG tablet Take 50 mg by mouth daily as needed (pain).     No current facility-administered medications on file prior to visit.   No family history on file. Social History   Social History  . Marital Status: Married    Spouse Name: N/A  . Number of Children: N/A  . Years of Education: N/A   Occupational History  . Not on file.   Social History Main Topics  . Smoking status: Never Smoker   . Smokeless tobacco: Not on file  . Alcohol Use: No  . Drug Use: Not on file  . Sexual Activity: Not on file   Other Topics Concern  . Not on file   Social History Narrative  . No narrative on file    Review of Systems: Constitutional: Negative for fever, chills, diaphoresis, activity change, appetite change and fatigue.  +weight gain, ?mole on back, looks new/different. HENT: Negative for ear pain, nosebleeds, congestion, facial swelling, rhinorrhea, neck pain, neck stiffness and ear discharge.  Eyes: Negative for pain, discharge, redness, itching and visual disturbance. Respiratory: Negative for cough, choking, chest tightness, shortness of breath, wheezing and stridor.  Cardiovascular: Negative for chest pain, palpitations and leg  swelling. Gastrointestinal: Negative for abdominal distention. Genitourinary: Negative for dysuria, urgency, frequency, hematuria, flank pain, decreased urine volume, difficulty urinating and dyspareunia.   +brown discharge last day or so, no burning/fishy odor.    Has not had period since Sept 2016.  +hot flashes. Musculoskeletal: Negative for back pain, joint swelling, arthralgia and gait problem. Neurological: Negative for dizziness, tremors, seizures, syncope, facial asymmetry, speech difficulty, weakness, light-headedness, numbness and headaches.  Hematological: Negative for adenopathy. Does not bruise/bleed  easily. Psychiatric/Behavioral: Negative for hallucinations, behavioral problems, confusion, dysphoric mood, decreased concentration and agitation.   Skin/ itchy/sweaty rash under breast, groin region.   Objective:  There were no vitals filed for this visit.  Physical Exam: Constitutional: Patient appears well-developed and well-nourished. No distress.  obese HENT: Normocephalic, atraumatic, External right and left ear normal. Oropharynx is clear and moist.  Eyes: Conjunctivae and EOM are normal. PERRL, no scleral icterus. Neck: Normal ROM. Neck supple. No JVD. No tracheal deviation. No thyromegaly. CVS: RRR, S1/S2 +, no murmurs, no gallops, no carotid bruit.  Pulmonary: Effort and breath sounds normal, no stridor, rhonchi, wheezes, rales.  Abdominal: Soft. BS +, no distension, tenderness, rebound or guarding.  Musculoskeletal: Normal range of motion. No edema and no tenderness.  Lymphadenopathy: No lymphadenopathy noted, cervical, inguinal or axillary Neuro: Alert. Normal reflexes, muscle tone coordination. No cranial nerve deficit. Skin: Skin is warm and dry. No rash noted. Not diaphoretic. No erythema. No pallor.  + sehorrheic karatosi on right upper back, dry/scaly/pruritic. Psychiatric: Normal mood and affect. Behavior, judgment, thought content normal.  Lab Results  Component Value Date   WBC 9.3 07/19/2015   HGB 12.0 07/19/2015   HCT 38.1 07/19/2015   MCV 79.7 07/19/2015   PLT 365 07/19/2015   Lab Results  Component Value Date   CREATININE 0.96 07/19/2015   BUN 15 07/19/2015   NA 141 07/19/2015   K 3.7 07/19/2015   CL 106 07/19/2015   CO2 27 07/19/2015    No results found for: HGBA1C Lipid Panel  No results found for: CHOL, TRIG, HDL, CHOLHDL, VLDL, LDLCALC     Assessment and plan:   1. Diabetes mellitus screening - + borderline dm noted, dw pt dx/plan, recd starting metformin 500bid now, will help w/ glucose intolerance and help w/ weight loss. - HgB A1c  6.4  2. Left shoulder pain - about the see, see below.  3. Bicipital tendonitis of shoulder, left, per ED, now ortho concerned for Rotator cuff tear - fu w/ ortho on Friday, she said they are concerned of rotator cuff tear, so has appt for MRI on 3/11. - continue meloxicam prn, recd to take w/ food.  4. Weight gain - 40 lbs last few months, may be due to diet/decrease exercise, vs thryoid disease/menopausal syndrome.  5. Menopausal syndrome (hot flashes , weight gain) - fu w/ me in 1-2 months for annual, get fasting labs including bmp, cbc, tsh, fasting lipids and papsmear. - dw pt recd for mammogram, never had 1, no one in family w/ breast cancer, she is scared.  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  6. Cutaneous candidiasis and possible Vaginal candidiasis - rx nystatin powder topically tid  - diflucan 150mg  po qday x 3days.  7. Seborrheic karitosis, vs nevi, right back shoulder on exam  - pleuritic, dry, changing size per pt.  - referral w/ derm placed.  [facility], so should be able to get appt. Fu w/ me 1-2 months for annual.  The patient was given clear instructions to go to ER or return to medical center if symptoms don't improve, worsen or new problems develop. The patient verbalized understanding. The patient was told to call to get lab results if they haven't heard anything in the next week.     Maren Reamer, MD, [place], [place]   08/05/2015, 3:09 PM

## 2015-08-05 NOTE — Progress Notes (Signed)
Patient here to establish care She is concerned about her left shoulder pain-she states she has an MRI scheduled for 3/11 She states "I have a hernia in my esophagus that MD needs to check" Also has a "mole" on her back that she said she just noticed

## 2015-08-05 NOTE — Patient Instructions (Addendum)
Fu w/ me 1-2 months for annual, papsmear, fasting labs  - pick up rx - set up appt for derm/ changing mole right upper back. - office will call - Dm education//   Aim for 30 minutes of exercise most days. Rethink what you drink. Water is great! Aim for 2-3 Carb Choices per meal (30-45 grams) +/- 1 either way.  Aim for 0-15 Carbs per snack if hungry.  Include protein in moderation with your meals and snacks.  Consider reading food labels for Total Carbohydrate and Fat Grams of foods  Consider checking blood glucose (accuchecks/BG) at alternate times per day.  Continue taking medication as directed. Be mindful about how much sugar you are adding to beverages and other foods. Fruit Punch - find one with no sugar  Measure and decrease portions of carbohydrate foods. Make your plate and don't go back for seconds.

## 2015-08-06 ENCOUNTER — Encounter: Payer: Self-pay | Admitting: Internal Medicine

## 2015-08-16 MED FILL — predniSONE 10 MG TABS: 10 | 6 days supply | Qty: 21 | Fill #0

## 2015-09-06 MED FILL — ?METFORMIN HCL 500MG TABLET: 500 | 30 days supply | Qty: 60 | Fill #1

## 2015-09-16 ENCOUNTER — Ambulatory Visit: Payer: No Typology Code available for payment source

## 2015-10-18 MED FILL — ?METFORMIN HCL 500MG TABLET: 500 | 30 days supply | Qty: 60 | Fill #2

## 2015-10-18 MED FILL — NYSTOP 100,000 UNITS/GM PWD: 100000 | 15 days supply | Qty: 60 | Fill #1

## 2015-11-04 ENCOUNTER — Ambulatory Visit: Attending: Internal Medicine | Admitting: *Deleted

## 2015-11-04 DIAGNOSIS — Z0000 Encounter for general adult medical examination without abnormal findings: Secondary | ICD-10-CM | POA: Insufficient documentation

## 2015-11-04 NOTE — Progress Notes (Signed)
Pt here for PPD testing for new job Pt was notified need to return on 06/07/20117 after 11:50am  or 11/07/2015 before 11:50 am   Given on Lt arm without negative reaction

## 2015-11-04 NOTE — Patient Instructions (Signed)
notified need to return on 06/07/20117 after 11:50am  or 11/07/2015 before 11:50 am

## 2015-11-06 ENCOUNTER — Ambulatory Visit: Attending: Internal Medicine | Admitting: *Deleted

## 2015-11-06 VITALS — Temp 98.8°F

## 2015-11-06 DIAGNOSIS — Z111 Encounter for screening for respiratory tuberculosis: Secondary | ICD-10-CM | POA: Diagnosis not present

## 2015-11-06 DIAGNOSIS — Z0000 Encounter for general adult medical examination without abnormal findings: Secondary | ICD-10-CM | POA: Insufficient documentation

## 2015-11-06 LAB — TB SKIN TEST
INDURATION: 0 mm
TB SKIN TEST: NEGATIVE

## 2015-11-06 NOTE — Patient Instructions (Signed)
Patient received a copy of results for work.

## 2015-11-06 NOTE — Progress Notes (Signed)
Patient had no fever. Patient complains of sore throat. Patient relates to having asthma with mouth breathing and husband having A/C running all night.

## 2015-11-13 MED FILL — NYSTOP 100,000 UNITS/GM PWD: 100000 | 15 days supply | Qty: 60 | Fill #2

## 2015-11-19 ENCOUNTER — Other Ambulatory Visit (HOSPITAL_COMMUNITY)
Admission: RE | Admit: 2015-11-19 | Discharge: 2015-11-19 | Disposition: A | Discharge status: Home / Self Care | Source: Ambulatory Visit | Attending: Internal Medicine | Admitting: Internal Medicine

## 2015-11-19 ENCOUNTER — Encounter: Payer: Self-pay | Admitting: Internal Medicine

## 2015-11-19 ENCOUNTER — Ambulatory Visit: Payer: No Typology Code available for payment source | Attending: Internal Medicine | Admitting: Internal Medicine

## 2015-11-19 VITALS — BP 105/72 | HR 75 | Temp 98.1°F | Ht 65.0 in | Wt 213.0 lb

## 2015-11-19 DIAGNOSIS — Z1231 Encounter for screening mammogram for malignant neoplasm of breast: Secondary | ICD-10-CM

## 2015-11-19 DIAGNOSIS — Z1211 Encounter for screening for malignant neoplasm of colon: Secondary | ICD-10-CM

## 2015-11-19 DIAGNOSIS — Z01419 Encounter for gynecological examination (general) (routine) without abnormal findings: Secondary | ICD-10-CM | POA: Diagnosis present

## 2015-11-19 DIAGNOSIS — Z124 Encounter for screening for malignant neoplasm of cervix: Secondary | ICD-10-CM | POA: Diagnosis not present

## 2015-11-19 DIAGNOSIS — R0602 Shortness of breath: Secondary | ICD-10-CM | POA: Diagnosis not present

## 2015-11-19 DIAGNOSIS — R7303 Prediabetes: Secondary | ICD-10-CM

## 2015-11-19 DIAGNOSIS — Z1151 Encounter for screening for human papillomavirus (HPV): Secondary | ICD-10-CM | POA: Insufficient documentation

## 2015-11-19 DIAGNOSIS — Z131 Encounter for screening for diabetes mellitus: Secondary | ICD-10-CM | POA: Diagnosis not present

## 2015-11-19 DIAGNOSIS — Z113 Encounter for screening for infections with a predominantly sexual mode of transmission: Secondary | ICD-10-CM | POA: Diagnosis present

## 2015-11-19 DIAGNOSIS — K219 Gastro-esophageal reflux disease without esophagitis: Secondary | ICD-10-CM

## 2015-11-19 LAB — POCT URINALYSIS DIP (MANUAL ENTRY)
Blood, UA: NEGATIVE
GLUCOSE UA: NEGATIVE
Leukocytes, UA: NEGATIVE
NITRITE UA: NEGATIVE
Protein Ur, POC: NEGATIVE
SPEC GRAV UA: 1.025
UROBILINOGEN UA: 0.2
pH, UA: 5.5

## 2015-11-19 LAB — GLUCOSE, POCT (MANUAL RESULT ENTRY): POC GLUCOSE: 80 mg/dL (ref 70–99)

## 2015-11-19 MED ORDER — OMEPRAZOLE 40 MG PO CPDR: 40.0000 mg | DELAYED_RELEASE_CAPSULE | Freq: Every day | ORAL | Status: DC

## 2015-11-19 MED ORDER — METFORMIN HCL 500 MG PO TABS: 1000.0000 mg | ORAL_TABLET | Freq: Two times a day (BID) | ORAL | Status: DC

## 2015-11-19 MED ORDER — DICLOFENAC SODIUM 1 % TD GEL: 2.0000 g | Freq: Four times a day (QID) | TRANSDERMAL | Status: DC

## 2015-11-19 MED ORDER — ALBUTEROL SULFATE HFA 108 (90 BASE) MCG/ACT IN AERS: 2.0000 | INHALATION_SPRAY | Freq: Four times a day (QID) | RESPIRATORY_TRACT | Status: DC | PRN

## 2015-11-19 MED ORDER — RANITIDINE HCL 150 MG PO CAPS: 150.0000 mg | ORAL_CAPSULE | Freq: Two times a day (BID) | ORAL | Status: DC

## 2015-11-19 MED FILL — metFORMIN HCL 500 MG TABS: 500 | 45 days supply | Qty: 180 | Fill #0 | Status: TO

## 2015-11-19 MED FILL — raNITIdine HCL 150 MG TABS: 150 | 30 days supply | Qty: 60 | Fill #0

## 2015-11-19 MED FILL — OMEPRAZOLE DR 40 MG CAPSULE: 40 | 30 days supply | Qty: 30 | Fill #0

## 2015-11-19 MED FILL — PROAIR HFA 90 MCG INHALER: 108 (90 BAS | 20 days supply | Qty: 9 | Fill #0

## 2015-11-19 NOTE — Progress Notes (Signed)
Lisa Galloway, is a 55 y.o. female  [id number]  [id number]  DOB - May 30, 1961  Chief Complaint  Patient presents with  . Annual Exam  . Knee Pain    Onset 7-8 months and stiffness        Subjective:   Lisa Galloway is a 55 y.o. female here today for a follow up visit, with numerous complaints.  Had sore throat about week about, and her acid reflux is out of control.  She has been on prilosec for longtime, but not working well. She wakes up sometime w/ heart burn.  Denies cough currently.  Also, c/o of bilat knee popping and pain at times. She has not noticed any weight loss since starting metformin, but actually gained some weight when she was transiently unemployed.  She is currently employed again.  Patient has No headache, No chest pain, No abdominal pain - No Nausea, No new weakness tingling or numbness, No Cough - SOB.  No problems updated.  ALLERGIES: Allergies  Allergen Reactions  . Ginger Hives    Possible reaction to ginger root  . Latex Itching  . Oxycodone Nausea And Vomiting and Other (See Comments)    Heart racing, flushed, "felt like a heart attack"    PAST MEDICAL HISTORY: Past Medical History  Diagnosis Date  . Asthma     MEDICATIONS AT HOME: Prior to Admission medications   Medication Sig Start Date End Date Taking? Authorizing Provider  albuterol (PROAIR HFA) 108 (90 Base) MCG/ACT inhaler Inhale 2 puffs into the lungs every 6 (six) hours as needed for wheezing or shortness of breath. 11/19/15  Yes Maren Reamer, MD  ferrous sulfate 325 (65 FE) MG tablet Take 325 mg by mouth daily with breakfast.   Yes Historical Provider, MD  metFORMIN (GLUCOPHAGE) 500 MG tablet Take 2 tablets (1,000 mg total) by mouth 2 (two) times daily with a meal. 11/19/15  Yes Maren Reamer, MD  Multiple Vitamin (MULTIVITAMIN WITH MINERALS) TABS tablet Take 1 tablet by mouth daily.   Yes Historical Provider, MD  diclofenac sodium (VOLTAREN) 1 % GEL Apply 2 g  topically 4 (four) times daily. 11/19/15   Maren Reamer, MD  omeprazole (PRILOSEC) 40 MG capsule Take 1 capsule (40 mg total) by mouth daily. 11/19/15   Maren Reamer, MD  ranitidine (ZANTAC) 150 MG capsule Take 1 capsule (150 mg total) by mouth 2 (two) times daily. 11/19/15   Maren Reamer, MD   Outside EGD 02/20/13 Large hiatal hernia Lower third of esophagus w/ mildly tortuous. GERD evident by free flow of gastric contents in middle 3rd of esophagus.  - GI recd acid suppression med daily at time.  Objective:   Filed Vitals:   11/19/15 1636  BP: 105/72  Pulse: 75  Temp: 98.1 F (36.7 C)  TempSrc: Oral  Height: 5\' 5"  (1.651 m)  Weight: 213 lb (96.616 kg)  SpO2: 100%   Pelvic exam performed w/ CMA.  bmi 35  Exam General appearance : Awake, alert, not in any distress. Speech Clear. Not toxic looking. Morbid obese. HEENT: Atraumatic and Normocephalic, Neck: supple, no JVD.  Chest:Good air entry bilaterally, no added sounds. CVS: S1 S2 regular, no murmurs/gallups or rubs. Abdomen: Bowel sounds active, obese. Non tender and not distended with no gaurding, rigidity or rebound. Pelvic Exam: Cervix normal in appearance, rotated slightly to right, external genitalia normal, no adnexal masses or tenderness, no cervical motion tenderness, rectovaginal septum normal, uterus normal size, shape, and consistency  and vagina normal with white discharge   Extremities: B/L Lower Ext shows no edema, both legs are warm to touch Neurology: Awake alert, and oriented X 3, CN II-XII grossly intact, Non focal Skin:No Rash  Data Review Lab Results  Component Value Date   HGBA1C 6.4 08/05/2015    Depression screen PHQ 2/9 08/05/2015  Decreased Interest 0  Down, Depressed, Hopeless 0  PHQ - 2 Score 0      Assessment & Plan   1. Pap smear for cervical cancer screening - Cytology - PAP /wet prep - POCT urinalysis dipstick  2. Colon cancer screening - Amb ref gi/colonoscopy. - pt  has fhx of polyps (mom), no prior cscope.  3. Encounter for screening mammogram for breast cancer Pt w/ hx of breast cyst, very hesitant about mm, but amendable to it. - MM Digital Screening; Future  4. SOB (shortness of breath), not using mdi much. Appears resolved, ?acid reflux, will increase protonix to 40 qday and monitor Renewed albuterol mdi for asthma  5. Gastroesophageal reflux disease without esophagitis - prilosec 40 qday trial - patient brought in her outside EGD report 02/20/13, will have scanned.  6. Prediabetes - Glucose (CBG) 80 - metFORMIN (GLUCOPHAGE) 500 MG tablet; Take 2 tablets (1,000 mg total) by mouth 2 (two) times daily with a meal.  Dispense: 180 tablet; Refill: 3  7. Morbid obesity. bmi 35 - increase metformin 1000 bid, not having any gi c/o w/ it currently.  8. bilat knee pains Suspect OA w/ morbid obesity.  trial voltaren gel.  Patient have been counseled extensively about nutrition and exercise  Return in about 3 months (around 02/19/2016).  The patient was given clear instructions to go to ER or return to medical center if symptoms don't improve, worsen or new problems develop. The patient verbalized understanding. The patient was told to call to get lab results if they haven't heard anything in the next week.    Maren Reamer, MD, [facility] [place], [place]   11/19/2015, 5:36 PM

## 2015-11-19 NOTE — Patient Instructions (Signed)
Diabetes Mellitus and Food It is important for you to manage your blood sugar (glucose) level. Your blood glucose level can be greatly affected by what you eat. Eating healthier foods in the appropriate amounts throughout the day at about the same time each day will help you control your blood glucose level. It can also help slow or prevent worsening of your diabetes mellitus. Healthy eating may even help you improve the level of your blood pressure and reach or maintain a healthy weight.  General recommendations for healthful eating and cooking habits include:  Eating meals and snacks regularly. Avoid going long periods of time without eating to lose weight.  Eating a diet that consists mainly of plant-based foods, such as fruits, vegetables, nuts, legumes, and whole grains.  Using low-heat cooking methods, such as baking, instead of high-heat cooking methods, such as deep frying. Work with your dietitian to make sure you understand how to use the Nutrition Facts information on food labels. HOW CAN FOOD AFFECT ME? Carbohydrates Carbohydrates affect your blood glucose level more than any other type of food. Your dietitian will help you determine how many carbohydrates to eat at each meal and teach you how to count carbohydrates. Counting carbohydrates is important to keep your blood glucose at a healthy level, especially if you are using insulin or taking certain medicines for diabetes mellitus. Alcohol Alcohol can cause sudden decreases in blood glucose (hypoglycemia), especially if you use insulin or take certain medicines for diabetes mellitus. Hypoglycemia can be a life-threatening condition. Symptoms of hypoglycemia (sleepiness, dizziness, and disorientation) are similar to symptoms of having too much alcohol.  If your health care provider has given you approval to drink alcohol, do so in moderation and use the following guidelines:  Women should not have more than one drink per day, and men  should not have more than two drinks per day. One drink is equal to:  12 oz of beer.  5 oz of wine.  1 oz of hard liquor.  Do not drink on an empty stomach.  Keep yourself hydrated. Have water, diet soda, or unsweetened iced tea.  Regular soda, juice, and other mixers might contain a lot of carbohydrates and should be counted. WHAT FOODS ARE NOT RECOMMENDED? As you make food choices, it is important to remember that all foods are not the same. Some foods have fewer nutrients per serving than other foods, even though they might have the same number of calories or carbohydrates. It is difficult to get your body what it needs when you eat foods with fewer nutrients. Examples of foods that you should avoid that are high in calories and carbohydrates but low in nutrients include:  Trans fats (most processed foods list trans fats on the Nutrition Facts label).  Regular soda.  Juice.  Candy.  Sweets, such as cake, pie, doughnuts, and cookies.  Fried foods. WHAT FOODS CAN I EAT? Eat nutrient-rich foods, which will nourish your body and keep you healthy. The food you should eat also will depend on several factors, including:  The calories you need.  The medicines you take.  Your weight.  Your blood glucose level.  Your blood pressure level.  Your cholesterol level. You should eat a variety of foods, including:  Protein.  Lean cuts of meat.  Proteins low in saturated fats, such as fish, egg whites, and beans. Avoid processed meats.  Fruits and vegetables.  Fruits and vegetables that may help control blood glucose levels, such as apples, mangoes, and   yams.  Dairy products.  Choose fat-free or low-fat dairy products, such as milk, yogurt, and cheese.  Grains, bread, pasta, and rice.  Choose whole grain products, such as multigrain bread, whole oats, and brown rice. These foods may help control blood pressure.  Fats.  Foods containing healthful fats, such as nuts,  avocado, olive oil, canola oil, and fish. DOES EVERYONE WITH DIABETES MELLITUS HAVE THE SAME MEAL PLAN? Because every person with diabetes mellitus is different, there is not one meal plan that works for everyone. It is very important that you meet with a dietitian who will help you create a meal plan that is just right for you.   This information is not intended to replace advice given to you by your health care provider. Make sure you discuss any questions you have with your health care provider.   Document Released: 02/12/2005 Document Revised: 06/08/2014 Document Reviewed: 04/14/2013 Elsevier Interactive Patient Education 2016 [place] for Eating Away From Home If You Have Diabetes Controlling your level of blood glucose, also known as blood sugar, can be challenging. It can be even more difficult when you do not prepare your own meals. The following tips can help you manage your diabetes when you eat away from home. PLANNING AHEAD Plan ahead if you know you will be eating away from home:  Ask your health care provider how to time meals and medicine if you are taking insulin.  Make a list of restaurants near you that offer healthy choices. If they have a carry-out menu, take it home and plan what you will order ahead of time.  Look up the restaurant you want to eat at online. Many chain and fast-food restaurants list nutritional information online. Use this information to choose the healthiest options and to calculate how many carbohydrates will be in your meal.  Use a carbohydrate-counting book or mobile app to look up the carbohydrate content and serving size of the foods you want to eat.  Become familiar with serving sizes and learn to recognize how many servings are in a portion. This will allow you to estimate how many carbohydrates you can eat. FREE FOODS A "free food" is any food or drink that has less than 5 g of carbohydrates per serving. Free foods  include:  Many vegetables.  Hard boiled eggs.  Nuts or seeds.  Olives.  Cheeses.  Meats. These types of foods make good appetizer choices and are often available at salad bars. Lemon juice, vinegar, or a low-calorie salad dressing of fewer than 20 calories per serving can be used as a "free" salad dressing.  CHOICES TO REDUCE CARBOHYDRATES  Substitute nonfat sweetened yogurt with a sugar-free yogurt. Yogurt made from soy milk may also be used, but you will still want a sugar-free or plain option to choose a lower carbohydrate amount.  Ask your server to take away the bread basket or chips from your table.  Order fresh fruit. A salad bar often offers fresh fruit choices. Avoid canned fruit because it is usually packed in sugar or syrup.  Order a salad, and eat it without dressing. Or, create a "free" salad dressing.  Ask for substitutions. For example, instead of [place] fries, request an order of a vegetable such as salad, green beans, or broccoli. OTHER TIPS   If you take insulin, take the insulin once your food arrives to your table. This will ensure your insulin and food are timed correctly.  Ask your server about the  portion size before your order, and ask for a take-out box if the portion has more servings than you should have. When your food comes, leave the amount you should have on the plate, and put the rest in the take-out box.  Consider splitting an entree with someone and ordering a side salad.   This information is not intended to replace advice given to you by your health care provider. Make sure you discuss any questions you have with your health care provider.   Document Released: 05/18/2005 Document Revised: 02/06/2015 Document Reviewed: 08/15/2013 Elsevier Interactive Patient Education 2016 Else[employer] Inc[employer] -  Diabetes and Exercise Exercising regularly is important. It is not just about losing weight. It has many health benefits, such as:  Improving your  overall fitness, flexibility, and endurance.  Increasing your bone density.  Helping with weight control.  Decreasing your body fat.  Increasing your muscle strength.  Reducing stress and tension.  Improving your overall health. People with diabetes who exercise gain additional benefits because exercise:  Reduces appetite.  Improves the body's use of blood sugar (glucose).  Helps lower or control blood glucose.  Decreases blood pressure.  Helps control blood lipids (such as cholesterol and triglycerides).  Improves the body's use of the hormone insulin by:  Increasing the body's insulin sensitivity.  Reducing the body's insulin needs.  Decreases the risk for heart disease because exercising:  Lowers cholesterol and triglycerides levels.  Increases the levels of good cholesterol (such as high-density lipoproteins [HDL]) in the body.  Lowers blood glucose levels. YOUR ACTIVITY PLAN  Choose an activity that you enjoy, and set realistic goals. To exercise safely, you should begin practicing any new physical activity slowly, and gradually increase the intensity of the exercise over time. Your health care provider or diabetes educator can help create an activity plan that works for you. General recommendations include:  Encouraging children to engage in at least 60 minutes of physical activity each day.  Stretching and performing strength training exercises, such as yoga or weight lifting, at least 2 times per week.  Performing a total of at least 150 minutes of moderate-intensity exercise each week, such as brisk walking or water aerobics.  Exercising at least 3 days per week, making sure you allow no more than 2 consecutive days to pass without exercising.  Avoiding long periods of inactivity (90 minutes or more). When you have to spend an extended period of time sitting down, take frequent breaks to walk or stretch. RECOMMENDATIONS FOR EXERCISING WITH TYPE 1 OR TYPE 2  DIABETES   Check your blood glucose before exercising. If blood glucose levels are greater than 240 mg/dL, check for urine ketones. Do not exercise if ketones are present.  Avoid injecting insulin into areas of the body that are going to be exercised. For example, avoid injecting insulin into:  The arms when playing tennis.  The legs when jogging.  Keep a record of:  Food intake before and after you exercise.  Expected peak times of insulin action.  Blood glucose levels before and after you exercise.  The type and amount of exercise you have done.  Review your records with your health care provider. Your health care provider will help you to develop guidelines for adjusting food intake and insulin amounts before and after exercising.  If you take insulin or oral hypoglycemic agents, watch for signs and symptoms of hypoglycemia. They include:  Dizziness.  Shaking.  Sweating.  Chills.  Confusion.  Drink plenty of water   while you exercise to prevent dehydration or heat stroke. Body water is lost during exercise and must be replaced.  Talk to your health care provider before starting an exercise program to make sure it is safe for you. Remember, almost any type of activity is better than none.   This information is not intended to replace advice given to you by your health care provider. Make sure you discuss any questions you have with your health care provider.   Document Released: 08/08/2003 Document Revised: 10/02/2014 Document Reviewed: 10/25/2012 Elsevier Interactive Patient Education 2016 Elsevier Inc.    

## 2015-11-20 ENCOUNTER — Encounter: Payer: Self-pay | Admitting: Gastroenterology

## 2015-11-21 LAB — CERVICOVAGINAL ANCILLARY ONLY
CHLAMYDIA, DNA PROBE: NEGATIVE
NEISSERIA GONORRHEA: NEGATIVE
TRICH (WINDOWPATH): NEGATIVE

## 2015-11-21 LAB — CYTOLOGY - PAP

## 2015-11-25 LAB — CERVICOVAGINAL ANCILLARY ONLY: CANDIDA VAGINITIS: NEGATIVE

## 2015-11-26 ENCOUNTER — Other Ambulatory Visit: Payer: Self-pay | Admitting: Internal Medicine

## 2015-11-26 ENCOUNTER — Telehealth: Payer: Self-pay | Admitting: *Deleted

## 2015-11-26 MED ORDER — METRONIDAZOLE 500 MG PO TABS: 500.0000 mg | ORAL_TABLET | Freq: Two times a day (BID) | ORAL | Status: DC

## 2015-11-26 NOTE — Telephone Encounter (Signed)
Medical Assistant left message on patient's home and cell voicemail. Voicemail states to give a call back to Lisa Galloway with [facility] at [phone].  

## 2015-11-26 NOTE — Telephone Encounter (Signed)
-----   Message from Maren Reamer, MD sent at 11/26/2015  9:15 AM EDT ----- Please call results.  Pt's labs + BV.  (not an STD). rx for Flagyl bid x 7days prescribed.  Probiotics such as Activia yogurt bid and no PH balance soaps may help as well get her bacteria flora back to normal. Thanks.

## 2015-11-27 ENCOUNTER — Ambulatory Visit
Admission: RE | Admit: 2015-11-27 | Discharge: 2015-11-27 | Disposition: A | Discharge status: Home / Self Care | Source: Ambulatory Visit | Attending: Internal Medicine | Admitting: Internal Medicine

## 2015-11-27 DIAGNOSIS — Z1231 Encounter for screening mammogram for malignant neoplasm of breast: Secondary | ICD-10-CM

## 2015-12-05 MED FILL — metroNIDAZOLE 500 MG TABS: 500 | 7 days supply | Qty: 14 | Fill #0

## 2015-12-05 MED FILL — DICLOFENAC SODIUM 1% GEL: 1 | 20 days supply | Qty: 100 | Fill #0

## 2015-12-16 ENCOUNTER — Telehealth: Payer: Self-pay | Admitting: Internal Medicine

## 2015-12-16 NOTE — Telephone Encounter (Signed)
Patient would like results. 

## 2015-12-16 NOTE — Telephone Encounter (Signed)
Will forward to pcp

## 2015-12-16 NOTE — Telephone Encounter (Addendum)
Patient would like results from last visit. Please follow up.

## 2015-12-17 NOTE — Telephone Encounter (Signed)
Called pt, verified dob.  informed her MM was negative, needs repeat in 2 years.

## 2015-12-17 NOTE — Telephone Encounter (Signed)
Please call pt on lab results, Lisa Galloway has talked to her already about this.  Per recent calls on labs, see below.  Notes Recorded by Cristopher Peru, CMA on 11/27/2015 at 3:20 PM Clld pt - advsd of lab results;Rx Flagyl; recommendations to help her get her bacteria flora back to normal. Pt stated she understood. Notes Recorded by Maren Reamer, MD on 11/26/2015 at 9:15 AM Please call results. Pt's labs + BV. (not an STD). rx for Flagyl bid x 7days prescribed. Probiotics such as Activia yogurt bid and no PH balance soaps may help as well get her bacteria flora back to normal. Thanks. Notes Recorded by Maren Reamer, MD on 11/23/2015 at 7:53 AM Please call pt, papsmear negative need repeat in 3 years. Neg std screening. Thanks.

## 2016-01-01 MED FILL — raNITIdine HCL 150 MG TABS: 150 | 30 days supply | Qty: 60 | Fill #1

## 2016-01-01 MED FILL — OMEPRAZOLE DR 40 MG CAPSULE: 40 | 30 days supply | Qty: 30 | Fill #1 | Status: TO

## 2016-01-01 NOTE — Progress Notes (Signed)
PCP advsd pt of MM results on 07/17-18/17.

## 2016-01-22 ENCOUNTER — Encounter (INDEPENDENT_AMBULATORY_CARE_PROVIDER_SITE_OTHER): Payer: Self-pay

## 2016-01-22 ENCOUNTER — Ambulatory Visit (INDEPENDENT_AMBULATORY_CARE_PROVIDER_SITE_OTHER): Admitting: Gastroenterology

## 2016-01-22 ENCOUNTER — Encounter: Payer: Self-pay | Admitting: Gastroenterology

## 2016-01-22 VITALS — BP 102/60 | HR 62 | Ht 65.0 in | Wt 210.4 lb

## 2016-01-22 DIAGNOSIS — Z800 Family history of malignant neoplasm of digestive organs: Secondary | ICD-10-CM

## 2016-01-22 DIAGNOSIS — K219 Gastro-esophageal reflux disease without esophagitis: Secondary | ICD-10-CM | POA: Diagnosis not present

## 2016-01-22 DIAGNOSIS — E669 Obesity, unspecified: Secondary | ICD-10-CM

## 2016-01-22 DIAGNOSIS — R1310 Dysphagia, unspecified: Secondary | ICD-10-CM | POA: Diagnosis not present

## 2016-01-22 MED ORDER — NA SULFATE-K SULFATE-MG SULF 17.5-3.13-1.6 GM/177ML PO SOLN: 1.0000 | Freq: Once | ORAL | 0 refills | Status: AC

## 2016-01-22 NOTE — Progress Notes (Signed)
Lisa Galloway    [id number]    1960/12/07  Primary Care Physician:Dawn Lazarus Gowda, MD  Referring Physician: Maren Reamer, MD [address] [place], [place] [address]  Chief complaint:  GERD, hiatal hernia  HPI: 7 yr F here with c/o GERD, hiatal hernia. She is taking Omeprzole and Zantac at bedtime with no significant improvement. She has heartburn after every meal along with regurgitation and chest spasm. She has gained a significant amount weight in pasty few years and has been trying to loose weight unsuccessfully. She also c/o cough, choking senation with food getting stuck in her chest when she tries to eat worse with solids>liquids.  She has family h/o colon cancer in maternal grandmother and aunt. No h/o prior colonoscopy.     Outpatient Encounter Prescriptions as of 01/22/2016  Medication Sig  . albuterol (PROAIR HFA) 108 (90 Base) MCG/ACT inhaler Inhale 2 puffs into the lungs every 6 (six) hours as needed for wheezing or shortness of breath.  . diclofenac sodium (VOLTAREN) 1 % GEL Apply 2 g topically 4 (four) times daily.  . ferrous sulfate 325 (65 FE) MG tablet Take 325 mg by mouth daily with breakfast.  . metFORMIN (GLUCOPHAGE) 500 MG tablet Take 2 tablets (1,000 mg total) by mouth 2 (two) times daily with a meal.  . Multiple Vitamin (MULTIVITAMIN WITH MINERALS) TABS tablet Take 1 tablet by mouth daily.  Marland Kitchen omeprazole (PRILOSEC) 40 MG capsule Take 1 capsule (40 mg total) by mouth daily.  . ranitidine (ZANTAC) 150 MG capsule Take 1 capsule (150 mg total) by mouth 2 (two) times daily.  . [DISCONTINUED] metroNIDAZOLE (FLAGYL) 500 MG tablet Take 1 tablet (500 mg total) by mouth 2 (two) times daily.   No facility-administered encounter medications on file as of 01/22/2016.     Allergies as of 01/22/2016 - Review Complete 01/22/2016  Allergen Reaction Noted  . Ginger Hives 07/19/2015  . Latex Itching 07/19/2015  . Oxycodone Nausea And Vomiting and Other  (See Comments) 07/19/2015    Past Medical History:  Diagnosis Date  . Asthma   . Hayfever     History reviewed. No pertinent surgical history.  Family History  Problem Relation Age of Onset  . Kidney disease Mother   . Hypertension Sister   . Diabetes Sister   . Hypertension Brother   . Diabetes Brother   . Alcoholism Brother   . Hypertension Sister   . Diabetes Sister   . Colon cancer Maternal Grandmother   . Colon cancer Maternal Aunt     Social History   Social History  . Marital status: Married    Spouse name: N/A  . Number of children: 2  . Years of education: N/A   Occupational History  . nurse    Social History Main Topics  . Smoking status: Never Smoker  . Smokeless tobacco: Never Used  . Alcohol use No  . Drug use: No  . Sexual activity: Not on file   Other Topics Concern  . Not on file   Social History Narrative  . No narrative on file      Review of systems: Review of Systems  Constitutional: Negative for fever and chills.  HENT: Negative.   Eyes: Negative for blurred vision.  Respiratory: Positive for cough, shortness of breath and wheezing.   Cardiovascular: Negative for chest pain and palpitations.  Gastrointestinal: as per HPI Genitourinary: Negative for dysuria, urgency, frequency and hematuria.  Musculoskeletal: Positive for myalgias, back pain and joint pain.  Skin: Negative for itching and rash.  Neurological: Negative for dizziness, tremors, focal weakness, seizures and loss of consciousness.  Endo/Heme/Allergies: Positive for seasonal allergies.  Psychiatric/Behavioral: Negative for depression, suicidal ideas and hallucinations.  All other systems reviewed and are negative.   Physical Exam: Vitals:   01/22/16 0943  BP: 102/60  Pulse: 62   Body mass index is 35.01 kg/m. Gen:      No acute distress HEENT:  EOMI, sclera anicteric Neck:     No masses; no thyromegaly Lungs:    Clear to auscultation bilaterally; normal  respiratory effort CV:         Regular rate and rhythm; no murmurs Abd:      + bowel sounds; soft, non-tender; no palpable masses, no distension Ext:    No edema; adequate peripheral perfusion Skin:      Warm and dry; no rash Neuro: alert and oriented x 3 Psych: normal mood and affect  Data Reviewed: Reviewed chart in epic   Assessment and Plan/Recommendations: 43 yr F with h/o chronic GERD here with c/o persistent daily symptoms despite therapy and intermittent dysphagia. She has family h/o colon cancer in 2 relative (2nd degree) Will schedule for EGD for evaluation of dysphagia and persistent GERD symptoms Screening colonsocopy for colorectal cancer The risks and benefits as well as alternatives of endoscopic procedure(s) have been discussed and reviewed. All questions answered. The patient agrees to proceed. PPI twice daily before breakfast and dinner, Zantac at bedtime for nocturnal symptoms as needed Anti reflux measures Weight loss and exercise Greater than 50% of the time used for counseling as well as treatment plan and follow-up. She had multiple questions which were answered to her satisfaction  K. Denzil Magnuson , MD [phone] Mon-Fri 8a-5p [phone] after 5p, weekends, holidays  CC: Maren Reamer, MD

## 2016-01-22 NOTE — Patient Instructions (Signed)
You have been scheduled for an endoscopy and colonoscopy. Please follow the written instructions given to you at your visit today. Please pick up your prep supplies at the pharmacy within the next 1-3 days. If you use inhalers (even only as needed), please bring them with you on the day of your procedure. Your physician has requested that you go to www.startemmi.com and enter the access code given to you at your visit today. This web site gives a general overview about your procedure. However, you should still follow specific instructions given to you by our office regarding your preparation for the procedure.  We will send Omeprazole 40 mg to use twice a day to your pharmacy, Use  30 minutes before breakfast and dinner   Discontinue Zantac twice a day and take at bedtime only   Gastroesophageal Reflux Disease, Adult Normally, food travels down the esophagus and stays in the stomach to be digested. However, when a person has gastroesophageal reflux disease (GERD), food and stomach acid move back up into the esophagus. When this happens, the esophagus becomes sore and inflamed. Over time, GERD can create small holes (ulcers) in the lining of the esophagus.  CAUSES This condition is caused by a problem with the muscle between the esophagus and the stomach (lower esophageal sphincter, or LES). Normally, the LES muscle closes after food passes through the esophagus to the stomach. When the LES is weakened or abnormal, it does not close properly, and that allows food and stomach acid to go back up into the esophagus. The LES can be weakened by certain dietary substances, medicines, and medical conditions, including:  Tobacco use.  Pregnancy.  Having a hiatal hernia.  Heavy alcohol use.  Certain foods and beverages, such as coffee, chocolate, onions, and peppermint. RISK FACTORS This condition is more likely to develop in:  People who have an increased body weight.  People who have connective  tissue disorders.  People who use NSAID medicines. SYMPTOMS Symptoms of this condition include:  Heartburn.  Difficult or painful swallowing.  The feeling of having a lump in the throat.  Abitter taste in the mouth.  Bad breath.  Having a large amount of saliva.  Having an upset or bloated stomach.  Belching.  Chest pain.  Shortness of breath or wheezing.  Ongoing (chronic) cough or a night-time cough.  Wearing away of tooth enamel.  Weight loss. Different conditions can cause chest pain. Make sure to see your health care provider if you experience chest pain. DIAGNOSIS Your health care provider will take a medical history and perform a physical exam. To determine if you have mild or severe GERD, your health care provider may also monitor how you respond to treatment. You may also have other tests, including:  An endoscopy toexamine your stomach and esophagus with a small camera.  A test thatmeasures the acidity level in your esophagus.  A test thatmeasures how much pressure is on your esophagus.  A barium swallow or modified barium swallow to show the shape, size, and functioning of your esophagus. TREATMENT The goal of treatment is to help relieve your symptoms and to prevent complications. Treatment for this condition may vary depending on how severe your symptoms are. Your health care provider may recommend:  Changes to your diet.  Medicine.  Surgery. HOME CARE INSTRUCTIONS Diet  Follow a diet as recommended by your health care provider. This may involve avoiding foods and drinks such as:  Coffee and tea (with or without caffeine).  Drinks  that containalcohol.  Energy drinks and sports drinks.  Carbonated drinks or sodas.  Chocolate and cocoa.  Peppermint and mint flavorings.  Garlic and onions.  Horseradish.  Spicy and acidic foods, including peppers, chili powder, curry powder, vinegar, hot sauces, and barbecue sauce.  Citrus fruit  juices and citrus fruits, such as oranges, lemons, and limes.  Tomato-based foods, such as red sauce, chili, salsa, and pizza with red sauce.  Fried and fatty foods, such as donuts, french fries, potato chips, and high-fat dressings.  High-fat meats, such as hot dogs and fatty cuts of red and white meats, such as rib eye steak, sausage, ham, and bacon.  High-fat dairy items, such as whole milk, butter, and cream cheese.  Eat small, frequent meals instead of large meals.  Avoid drinking large amounts of liquid with your meals.  Avoid eating meals during the 2-3 hours before bedtime.  Avoid lying down right after you eat.  Do not exercise right after you eat. General Instructions  Pay attention to any changes in your symptoms.  Take over-the-counter and prescription medicines only as told by your health care provider. Do not take aspirin, ibuprofen, or other NSAIDs unless your health care provider told you to do so.  Do not use any tobacco products, including cigarettes, chewing tobacco, and e-cigarettes. If you need help quitting, ask your health care provider.  Wear loose-fitting clothing. Do not wear anything tight around your waist that causes pressure on your abdomen.  Raise (elevate) the head of your bed 6 inches (15cm).  Try to reduce your stress, such as with yoga or meditation. If you need help reducing stress, ask your health care provider.  If you are overweight, reduce your weight to an amount that is healthy for you. Ask your health care provider for guidance about a safe weight loss goal.  Keep all follow-up visits as told by your health care provider. This is important. SEEK MEDICAL CARE IF:  You have new symptoms.  You have unexplained weight loss.  You have difficulty swallowing, or it hurts to swallow.  You have wheezing or a persistent cough.  Your symptoms do not improve with treatment.  You have a hoarse voice. SEEK IMMEDIATE MEDICAL CARE  IF:  You have pain in your arms, neck, jaw, teeth, or back.  You feel sweaty, dizzy, or light-headed.  You have chest pain or shortness of breath.  You vomit and your vomit looks like blood or coffee grounds.  You faint.  Your stool is bloody or black.  You cannot swallow, drink, or eat.   This information is not intended to replace advice given to you by your health care provider. Make sure you discuss any questions you have with your health care provider.   Document Released: 02/25/2005 Document Revised: 02/06/2015 Document Reviewed: 09/12/2014 Elsevier Interactive Patient Education 2016 [place] for Gastroesophageal Reflux Disease, Adult When you have gastroesophageal reflux disease (GERD), the foods you eat and your eating habits are very important. Choosing the right foods can help ease the discomfort of GERD. WHAT GENERAL GUIDELINES DO I NEED TO FOLLOW?  Choose fruits, vegetables, whole grains, low-fat dairy products, and low-fat meat, fish, and poultry.  Limit fats such as oils, salad dressings, butter, nuts, and avocado.  Keep a food diary to identify foods that cause symptoms.  Avoid foods that cause reflux. These may be different for different people.  Eat frequent small meals instead of three large meals each day.  Eat  your meals slowly, in a relaxed setting.  Limit fried foods.  Cook foods using methods other than frying.  Avoid drinking alcohol.  Avoid drinking large amounts of liquids with your meals.  Avoid bending over or lying down until 2-3 hours after eating. WHAT FOODS ARE NOT RECOMMENDED? The following are some foods and drinks that may worsen your symptoms: Vegetables Tomatoes. Tomato juice. Tomato and spaghetti sauce. Chili peppers. Onion and garlic. Horseradish. Fruits Oranges, grapefruit, and lemon (fruit and juice). Meats High-fat meats, fish, and poultry. This includes hot dogs, ribs, ham, sausage, salami, and  bacon. Dairy Whole milk and chocolate milk. Sour cream. Cream. Butter. Ice cream. Cream cheese.  Beverages Coffee and tea, with or without caffeine. Carbonated beverages or energy drinks. Condiments Hot sauce. Barbecue sauce.  Sweets/Desserts Chocolate and cocoa. Donuts. Peppermint and spearmint. Fats and Oils High-fat foods, including [place] fries and potato chips. Other Vinegar. Strong spices, such as black pepper, white pepper, red pepper, cayenne, curry powder, cloves, ginger, and chili powder. The items listed above may not be a complete list of foods and beverages to avoid. Contact your dietitian for more information.   This information is not intended to replace advice given to you by your health care provider. Make sure you discuss any questions you have with your health care provider.   Document Released: 05/18/2005 Document Revised: 06/08/2014 Document Reviewed: 03/22/2013 Elsevier Interactive Patient Education [employer].

## 2016-02-17 ENCOUNTER — Encounter: Payer: Self-pay | Admitting: Gastroenterology

## 2016-02-17 ENCOUNTER — Ambulatory Visit (AMBULATORY_SURGERY_CENTER): Admitting: Gastroenterology

## 2016-02-17 VITALS — BP 98/78 | HR 73 | Temp 98.4°F | Resp 14 | Ht 65.0 in | Wt 210.0 lb

## 2016-02-17 DIAGNOSIS — Z800 Family history of malignant neoplasm of digestive organs: Secondary | ICD-10-CM

## 2016-02-17 DIAGNOSIS — Z1211 Encounter for screening for malignant neoplasm of colon: Secondary | ICD-10-CM

## 2016-02-17 DIAGNOSIS — R1310 Dysphagia, unspecified: Secondary | ICD-10-CM

## 2016-02-17 LAB — GLUCOSE, CAPILLARY
Glucose-Capillary: 88 mg/dL (ref 65–99)
Glucose-Capillary: 88 mg/dL (ref 65–99)

## 2016-02-17 MED ORDER — SODIUM CHLORIDE 0.9 % IV SOLN: 500.0000 mL | INTRAVENOUS | Status: DC

## 2016-02-17 NOTE — Progress Notes (Signed)
Report given to PACU RN, vss 

## 2016-02-17 NOTE — Progress Notes (Signed)
Pt. Told CRNA that she had chest pain on yesterday,crna made Dr.Nandigam aware and doctor ordered a 12 lead EKG.12 Lead obtained  doctor and CRNA aware of results.results placed in chart,they are proceding with procedures.

## 2016-02-17 NOTE — Patient Instructions (Signed)
Impression/recommendations:  Hiatal Hernia (handout given) Hemorrhoids (handout given)  Repeat colonoscopy in 5 years.  YOU HAD AN ENDOSCOPIC PROCEDURE TODAY AT [facility]:   Refer to the procedure report that was given to you for any specific questions about what was found during the examination.  If the procedure report does not answer your questions, please call your gastroenterologist to clarify.  If you requested that your care partner not be given the details of your procedure findings, then the procedure report has been included in a sealed envelope for you to review at your convenience later.  YOU SHOULD EXPECT: Some feelings of bloating in the abdomen. Passage of more gas than usual.  Walking can help get rid of the air that was put into your GI tract during the procedure and reduce the bloating. If you had a lower endoscopy (such as a colonoscopy or flexible sigmoidoscopy) you may notice spotting of blood in your stool or on the toilet paper. If you underwent a bowel prep for your procedure, you may not have a normal bowel movement for a few days.  Please Note:  You might notice some irritation and congestion in your nose or some drainage.  This is from the oxygen used during your procedure.  There is no need for concern and it should clear up in a day or so.  SYMPTOMS TO REPORT IMMEDIATELY:   Following lower endoscopy (colonoscopy or flexible sigmoidoscopy):  Excessive amounts of blood in the stool  Significant tenderness or worsening of abdominal pains  Swelling of the abdomen that is new, acute  Fever of 100F or higher   Following upper endoscopy (EGD)  Vomiting of blood or coffee ground material  New chest pain or pain under the shoulder blades  Painful or persistently difficult swallowing  New shortness of breath  Fever of 100F or higher  Black, tarry-looking stools  For urgent or emergent issues, a gastroenterologist can be reached at any hour by  calling [phone].   DIET:  We do recommend a small meal at first, but then you may proceed to your regular diet.  Drink plenty of fluids but you should avoid alcoholic beverages for 24 hours.  ACTIVITY:  You should plan to take it easy for the rest of today and you should NOT DRIVE or use heavy machinery until tomorrow (because of the sedation medicines used during the test).    FOLLOW UP: Our staff will call the number listed on your records the next business day following your procedure to check on you and address any questions or concerns that you may have regarding the information given to you following your procedure. If we do not reach you, we will leave a message.  However, if you are feeling well and you are not experiencing any problems, there is no need to return our call.  We will assume that you have returned to your regular daily activities without incident.  If any biopsies were taken you will be contacted by phone or by letter within the next 1-3 weeks.  Please call us at [phone] if you have not heard about the biopsies in 3 weeks.    SIGNATURES/CONFIDENTIALITY: You and/or your care partner have signed paperwork which will be entered into your electronic medical record.  These signatures attest to the fact that that the information above on your After Visit Summary has been reviewed and is understood.  Full responsibility of the confidentiality of this discharge information lies with  you and/or your care-partner.

## 2016-02-17 NOTE — Op Note (Signed)
[place] Patient Name: Lisa Galloway Procedure Date: 02/17/2016 2:27 PM MRN: [id number] Endoscopist: Mauri Pole , MD Age: 55 Referring MD:  Date of Birth: 1960/06/02 Gender: Female Account #: [id number] Procedure:                Upper GI endoscopy Indications:              Dysphagia, Esophageal reflux symptoms that persist                            despite appropriate therapy Medicines:                Monitored Anesthesia Care Procedure:                Pre-Anesthesia Assessment:                           - Prior to the procedure, a History and Physical                            was performed, and patient medications and                            allergies were reviewed. The patient's tolerance of                            previous anesthesia was also reviewed. The risks                            and benefits of the procedure and the sedation                            options and risks were discussed with the patient.                            All questions were answered, and informed consent                            was obtained. Prior Anticoagulants: The patient has                            taken no previous anticoagulant or antiplatelet                            agents. ASA Grade Assessment: II - A patient with                            mild systemic disease. After reviewing the risks                            and benefits, the patient was deemed in                            satisfactory condition to undergo the procedure.  After obtaining informed consent, the endoscope was                            passed under direct vision. Throughout the                            procedure, the patient's blood pressure, pulse, and                            oxygen saturations were monitored continuously. The                            Model GIF-HQ190 [id number]) scope was introduced                            through the mouth,  and advanced to the second part                            of duodenum. The upper GI endoscopy was                            accomplished without difficulty. The patient                            tolerated the procedure well. Scope In: Scope Out: Findings:                 The lumen of the lower third of the esophagus was                            mildly dilated.                           A large hiatal hernia was found. The proximal                            extent of the gastric folds (end of tubular                            esophagus) was 35 cm from the incisors. The hiatal                            narrowing was 50 cm from the incisors. The Z-line                            was 34 cm from the incisors.                           No gross lesions were noted in the entire examined                            stomach.                           The examined duodenum  was normal. Complications:            No immediate complications. Estimated Blood Loss:     Estimated blood loss: none. Impression:               - Dilation in the lower third of the esophagus.                           - Large hiatal hernia.                           - No gross lesions in the stomach.                           - Normal examined duodenum.                           - No specimens collected. Recommendation:           - Patient has a contact number available for                            emergencies. The signs and symptoms of potential                            delayed complications were discussed with the                            patient. Return to normal activities tomorrow.                            Written discharge instructions were provided to the                            patient.                           - Resume previous diet.                           - Continue present medications.                           - Perform routine esophageal manometry at                            appointment  to be scheduled.                           - Do an upper GI series at appointment to be                            scheduled.                           -Will refer to Dr Hassell Done ([facility]) for hiatal hernia  repair Mauri Pole, MD 02/17/2016 3:25:50 PM This report has been signed electronically.

## 2016-02-17 NOTE — Op Note (Signed)
[place] Patient Name: Lisa Galloway Procedure Date: 02/17/2016 2:27 PM MRN: [id number] Endoscopist: Mauri Pole , MD Age: 55 Referring MD:  Date of Birth: 10/28/1960 Gender: Female Account #: [id number] Procedure:                Colonoscopy Indications:              Colon cancer screening in patient at increased                            risk: Family history of colorectal cancer in                            multiple 2nd degree relatives, This is the                            patient's first colonoscopy Medicines:                Monitored Anesthesia Care Procedure:                Pre-Anesthesia Assessment:                           - Prior to the procedure, a History and Physical                            was performed, and patient medications and                            allergies were reviewed. The patient's tolerance of                            previous anesthesia was also reviewed. The risks                            and benefits of the procedure and the sedation                            options and risks were discussed with the patient.                            All questions were answered, and informed consent                            was obtained. Prior Anticoagulants: The patient has                            taken no previous anticoagulant or antiplatelet                            agents. ASA Grade Assessment: II - A patient with                            mild systemic disease. After reviewing the risks  and benefits, the patient was deemed in                            satisfactory condition to undergo the procedure.                           - Prior to the procedure, a History and Physical                            was performed, and patient medications and                            allergies were reviewed. The patient's tolerance of                            previous anesthesia was also reviewed. The  risks                            and benefits of the procedure and the sedation                            options and risks were discussed with the patient.                            All questions were answered, and informed consent                            was obtained. Prior Anticoagulants: The patient has                            taken no previous anticoagulant or antiplatelet                            agents. ASA Grade Assessment: II - A patient with                            mild systemic disease. After reviewing the risks                            and benefits, the patient was deemed in                            satisfactory condition to undergo the procedure.                           After obtaining informed consent, the colonoscope                            was passed under direct vision. Throughout the                            procedure, the patient's blood pressure, pulse, and  oxygen saturations were monitored continuously. The                            Model CF-HQ190L [id number]) scope was introduced                            through the anus and advanced to the the terminal                            ileum, with identification of the appendiceal                            orifice and IC valve. The colonoscopy was performed                            without difficulty. The patient tolerated the                            procedure well. The quality of the bowel                            preparation was excellent. The terminal ileum,                            ileocecal valve, appendiceal orifice, and rectum                            were photographed. Scope In: 3:07:06 PM Scope Out: 3:18:06 PM Scope Withdrawal Time: 0 hours 6 minutes 18 seconds  Total Procedure Duration: 0 hours 11 minutes 0 seconds  Findings:                 The perianal and digital rectal examinations were                            normal.                            Non-bleeding internal hemorrhoids were found during                            retroflexion. The hemorrhoids were small.                           The exam was otherwise without abnormality on                            direct and retroflexion views. Complications:            No immediate complications. Estimated Blood Loss:     Estimated blood loss: none. Impression:               - Non-bleeding internal hemorrhoids.                           - The examination was otherwise normal on direct  and retroflexion views.                           - No specimens collected. Recommendation:           - Patient has a contact number available for                            emergencies. The signs and symptoms of potential                            delayed complications were discussed with the                            patient. Return to normal activities tomorrow.                            Written discharge instructions were provided to the                            patient.                           - Resume previous diet.                           - Continue present medications.                           - Repeat colonoscopy in 5 years for screening                            purposes.                           - Return to GI clinic PRN. Mauri Pole, MD 02/17/2016 3:27:46 PM This report has been signed electronically.

## 2016-02-18 ENCOUNTER — Telehealth: Payer: Self-pay | Admitting: *Deleted

## 2016-02-18 NOTE — Telephone Encounter (Signed)
No answer, left message to call if questions or concerns. 

## 2016-03-26 ENCOUNTER — Ambulatory Visit: Attending: Internal Medicine | Admitting: Pharmacist

## 2016-03-26 DIAGNOSIS — Z23 Encounter for immunization: Secondary | ICD-10-CM

## 2016-03-26 NOTE — Patient Instructions (Signed)
Patient here for flu vaccine

## 2016-03-26 NOTE — Progress Notes (Signed)
Patient here for flu vaccine

## 2016-10-05 ENCOUNTER — Encounter: Admitting: Internal Medicine

## 2016-10-08 ENCOUNTER — Encounter: Payer: Self-pay | Admitting: Internal Medicine

## 2016-10-08 DIAGNOSIS — R7303 Prediabetes: Secondary | ICD-10-CM | POA: Insufficient documentation

## 2016-10-08 DIAGNOSIS — E669 Obesity, unspecified: Secondary | ICD-10-CM | POA: Insufficient documentation

## 2016-10-08 DIAGNOSIS — K219 Gastro-esophageal reflux disease without esophagitis: Secondary | ICD-10-CM | POA: Insufficient documentation

## 2016-10-12 ENCOUNTER — Encounter: Payer: Self-pay | Admitting: Internal Medicine

## 2016-10-12 ENCOUNTER — Ambulatory Visit (HOSPITAL_COMMUNITY)
Admission: RE | Admit: 2016-10-12 | Discharge: 2016-10-12 | Disposition: A | Discharge status: Home / Self Care | Source: Ambulatory Visit | Attending: Internal Medicine | Admitting: Internal Medicine

## 2016-10-12 ENCOUNTER — Other Ambulatory Visit: Payer: Self-pay

## 2016-10-12 ENCOUNTER — Ambulatory Visit: Attending: Internal Medicine | Admitting: Internal Medicine

## 2016-10-12 VITALS — BP 114/79 | HR 74 | Temp 98.1°F | Ht 65.0 in | Wt 215.6 lb

## 2016-10-12 DIAGNOSIS — L299 Pruritus, unspecified: Secondary | ICD-10-CM

## 2016-10-12 DIAGNOSIS — E669 Obesity, unspecified: Secondary | ICD-10-CM | POA: Diagnosis not present

## 2016-10-12 DIAGNOSIS — L80 Vitiligo: Secondary | ICD-10-CM

## 2016-10-12 DIAGNOSIS — D649 Anemia, unspecified: Secondary | ICD-10-CM

## 2016-10-12 DIAGNOSIS — Z01818 Encounter for other preprocedural examination: Secondary | ICD-10-CM

## 2016-10-12 DIAGNOSIS — R7303 Prediabetes: Secondary | ICD-10-CM | POA: Diagnosis present

## 2016-10-12 DIAGNOSIS — J4520 Mild intermittent asthma, uncomplicated: Secondary | ICD-10-CM | POA: Insufficient documentation

## 2016-10-12 DIAGNOSIS — K440 Diaphragmatic hernia with obstruction, without gangrene: Secondary | ICD-10-CM | POA: Insufficient documentation

## 2016-10-12 DIAGNOSIS — K449 Diaphragmatic hernia without obstruction or gangrene: Secondary | ICD-10-CM | POA: Insufficient documentation

## 2016-10-12 DIAGNOSIS — K219 Gastro-esophageal reflux disease without esophagitis: Secondary | ICD-10-CM

## 2016-10-12 LAB — GLUCOSE, POCT (MANUAL RESULT ENTRY): POC Glucose: 103 mg/dl — AB (ref 70–99)

## 2016-10-12 LAB — POCT GLYCOSYLATED HEMOGLOBIN (HGB A1C): Hemoglobin A1C: 6.3

## 2016-10-12 MED ORDER — HYDROCORTISONE VALERATE 0.2 % EX CREA: TOPICAL_CREAM | Freq: Two times a day (BID) | CUTANEOUS | Status: DC | PRN

## 2016-10-12 MED ORDER — ESOMEPRAZOLE MAGNESIUM 20 MG PO PACK: 20.0000 mg | PACK | Freq: Two times a day (BID) | ORAL | 12 refills | Status: DC

## 2016-10-12 MED ORDER — ALBUTEROL SULFATE HFA 108 (90 BASE) MCG/ACT IN AERS: 2.0000 | INHALATION_SPRAY | Freq: Four times a day (QID) | RESPIRATORY_TRACT | 2 refills | Status: DC | PRN

## 2016-10-12 MED ORDER — HYDROCORTISONE 2.5 % EX CREA: TOPICAL_CREAM | Freq: Two times a day (BID) | CUTANEOUS | 0 refills | Status: DC

## 2016-10-12 NOTE — Progress Notes (Addendum)
Pt is here for Physical and lab work.    Patient ID: Lisa Galloway, female    DOB: 1961/05/10  MRN: [id number]  CC: No chief complaint on file.   Subjective: Lisa Galloway is a 56 y.o. female who presents for physical exam as she is trying to be eligible to donate kidney to her mother who lives in [place].  Her concerns today include:  1.  Hx of pre-DM.  Stopped taking Metformin 4-5 mths ago because she did not feel she needed it any more.  Checks BS several times a wk; range 117-120.  BS in 140s several days ago after eating something sweet. "Also I know my coffee be way too sweet."  2.  Obesity: wanting to lose wgh. -she was seeing a nutritionist in the past and was on Phentermine 2-3 yrs ago.  Loss 30 lbs with that.  Was going to gym daily -"I think I'm addicted to food.  I stay [place] all the time. I fights it though." -she put down sodas as a new yr resolution. Drinks juices and sweet tea at times -trying to cut back on frying her chicken -works part time as Corporate treasurer.  Started walking 2 wks ago for about 1 hr once a wk   Just purchased stationary exercise bike with [employer].  She has not used as yet.  Also paying down on a treadmill which she prefers to use rather than bike. Wt Readings from Last 3 Encounters:  10/12/16 215 lb 9.6 oz (97.8 kg)  02/17/16 210 lb (95.3 kg)  01/22/16 210 lb 6 oz (95.4 kg)   3.  SOB when she goes up stairs in her house.  SOB gets better the longer she walks when she exercises. hx of asthma. Uses Albuterol when she does heavy activities.  Does not have to use every day -no CP when she exercises or goes up steps, No PND, orthopnea, LE edema   Patient Active Problem List   Diagnosis Date Noted  . GERD without esophagitis 10/08/2016  . Prediabetes 10/08/2016  . Obesity (BMI 30-39.9) 10/08/2016     Current Outpatient Prescriptions on File Prior to Visit  Medication Sig Dispense Refill  . albuterol (PROAIR HFA) 108 (90 Base) MCG/ACT inhaler Inhale 2  puffs into the lungs every 6 (six) hours as needed for wheezing or shortness of breath. 1 Inhaler 2  . diclofenac sodium (VOLTAREN) 1 % GEL Apply 2 g topically 4 (four) times daily. 100 g 2  . Multiple Vitamin (MULTIVITAMIN WITH MINERALS) TABS tablet Take 1 tablet by mouth daily.     Current Facility-Administered Medications on File Prior to Visit  Medication Dose Route Frequency Provider Last Rate Last Dose  . 0.9 %  sodium chloride infusion  500 mL Intravenous Continuous Nandigam, Venia Minks, MD        Allergies  Allergen Reactions  . Ginger Hives    Possible reaction to ginger root  . Latex Itching  . Oxycodone Nausea And Vomiting and Other (See Comments)    Heart racing, flushed, "felt like a heart attack"    Social History   Social History  . Marital status: Married    Spouse name: N/A  . Number of children: 2  . Years of education: N/A   Occupational History  . nurse    Social History Main Topics  . Smoking status: Never Smoker  . Smokeless tobacco: Never Used  . Alcohol use No  . Drug use: No  . Sexual activity: Not  on file   Other Topics Concern  . Not on file   Social History Narrative  . No narrative on file    Family History  Problem Relation Age of Onset  . Kidney disease Mother   . Diabetes Mother   . Hypertension Sister   . Diabetes Sister   . Hypertension Brother   . Diabetes Brother   . Alcoholism Brother   . Hypertension Sister   . Diabetes Sister   . Colon cancer Maternal Grandmother   . Colon cancer Maternal Aunt     No past surgical history on file.  ROS: Review of Systems  Constitutional: Negative for activity change, appetite change, fever and unexpected weight change.  HENT: Negative for hearing loss, nosebleeds, trouble swallowing and voice change. Facial swelling: hearing a little muffled.   Eyes: Positive for visual disturbance (wears reading glasses, has dry eyes). Negative for discharge.  Respiratory: Negative for apnea,  cough (see above) and wheezing.   Cardiovascular: Negative for chest pain, palpitations and leg swelling.  Gastrointestinal: Positive for vomiting (sometimes after meals.  Hx of hiatal hernia.  states she was told  she would need surgery.  Was suppose to see surgeon but no one never called. on and Prilosec Zantac but they do not help wit her reflux ). Negative for constipation and diarrhea.  Genitourinary: Negative for difficulty urinating, dysuria and vaginal discharge.  Skin: Negative for rash.       Some intermittent itching over LT shoulder blade where she had a lesion removed in past.  Neurological: Negative for dizziness, seizures, syncope and headaches.  Psychiatric/Behavioral:       Denies problems with depression or anxiety    PHYSICAL EXAM: BP 114/79   Pulse 74   Temp 98.1 F (36.7 C) (Oral)   Ht 5\' 5"  (1.651 m)   Wt 215 lb 9.6 oz (97.8 kg)   SpO2 97%   BMI 35.88 kg/m   General appearance - alert, well appearing, middle age AAF in no distress and oriented to person, place, and time Mental status - normal mood, behavior, speech, dress, motor activity, and thought processes Eyes - pupils equal and reactive, extraocular eye movements intact, sclera anicteric Ears - bilateral TM's and external ear canals normal Nose - normal and patent, no erythema, discharge or polyps Mouth - mucous membranes moist, pharynx normal without lesions Neck - supple, no significant adenopathy Lymphatics - no palpable lymphadenopathy, no hepatosplenomegaly Chest - clear to auscultation, no wheezes, rales or rhonchi, symmetric air entry Heart - normal rate, regular rhythm, normal S1, S2, no murmurs, rubs, clicks or gallops Abdomen - obese soft, nontender, nondistended, no masses or organomegaly Breasts - breasts appear normal, no suspicious masses, no skin or nipple changes or axillary nodes Neurological - cranial nerves II through XII intact, motor and sensory grossly normal  bilaterally Musculoskeletal - no joint tenderness, deformity or swelling Extremities - peripheral pulses normal, no pedal edema, no clubbing or cyanosis Skin -scattered hypopigmented spots on posterior thorax, a few on lower abdomen  . GAD 7 : Generalized Anxiety Score 11/19/2015 08/05/2015  Nervous, Anxious, on Edge 0 0  Control/stop worrying 0 0  Worry too much - different things 1 0  Trouble relaxing 1 0  Restless 0 0  Easily annoyed or irritable 1 0  Afraid - awful might happen 0 0  Total GAD 7 Score 3 0   Results for orders placed or performed in visit on 10/12/16  HgB A1c  Result Value  Ref Range   Hemoglobin A1C 6.3   Glucose (CBG)  Result Value Ref Range   POC Glucose 103 (A) 70 - 99 mg/dl    EKG:  Normal SR, normal EKG unchanged when compared to last year  ASSESSMENT AND PLAN: 1. Preop general physical exam -pt does have mild intermittent asthma that seems controlled with SABA inhaler.  She is able to walk for 1 hr without CP so I do not think we need to do stress test.   She does have pre-DM which may or may not prevent her from being a donor.  In any even, I discussed importance of dietary changes, wgh loss and exercise.  She seems motivated to do so and agreeable to referral to see nutritionist - Comprehensive metabolic panel - CBC - Lipid panel - HIV antibody, Hep C -EKG  2. GERD without esophagitis -pt had EGD done last year by Dr. Silverio Decamp from [place] that revealed a large hiatial hernia. Pt kept on Prilosec and Zantac and referred to general surgeon Dr. Lorn Junes for hernia repair.  However, pt states she was never called.  She still wants referral but wishes to hold off until after surgery to be kidney donor if she qualifies -GERD precautions discussed -discussed stopping Prilosec and Zantac and trying her with Nexium instead if insurance will cover -  3. Obesity (BMI 30-39.9) -see plan above Goals    . I will start exercising 3-4 times a week for at least 30-40  minutes.    . I will stop drinking sweet juices.       4. Prediabetes -still in range for pre-DM.  Discussed importance of changing eating habits and regular aerobic exercise. Pt wants to work on this.  Pt not wanting to go back on Metformin - HgB A1c - Glucose (CBG)  5. Vitiligo -skin hypopigmentation looks like this condition.  Can discuss further on f/u visit  6. Itching -no rash seen today but will give rxn strength Hydrocortosone cream to use PRN - hydrocortisone valerate cream (WESTCORT) 0.2 %; Apply topically 2 (two) times daily as needed (to affected area).  7. Mild intermittent asthma without complication -RF Albuterol.  Advise using prior to stranous activities PRN   Pt requested that  info (today's note and labs) be sent  Morrie Sheldon, kidney transplant co-ordinator for [facility] in [place]. Fax [phone] [facility] [phone] Patient was given the opportunity to ask questions.  Patient verbalized understanding of the plan and was able to repeat key elements of the plan.   Orders Placed This Encounter  Procedures  . Comprehensive metabolic panel  . CBC  . Lipid panel  . HIV antibody  . HgB A1c  . Glucose (CBG)     Requested Prescriptions   Pending Prescriptions Disp Refills  . albuterol (PROAIR HFA) 108 (90 Base) MCG/ACT inhaler 1 Inhaler 2    Sig: Inhale 2 puffs into the lungs every 6 (six) hours as needed for wheezing or shortness of breath.  . esomeprazole (NEXIUM) 20 MG packet 60 each 12    Sig: Take 20 mg by mouth 2 (two) times daily before a meal.    Return in about 3 months (around 01/12/2017).   Addendum 10/13/2016:  Hb 11.  Iron studies added.   Karle Plumber, MD, FACP

## 2016-10-12 NOTE — Patient Instructions (Addendum)
Please schedule to come back to have labs done. You have been referred to nutritionist.  Ask front desk about how you could get copy of note from today to send to transplant coordinator at [facility]  Follow a [place] can do it! Limit sugary drinks.  Avoid sodas, sweet tea, sport or energy drinks, or fruit drinks.  Drink water, lo-fat milk, or diet drinks. Limit snack foods.   Cut back on candy, cake, cookies, chips, ice cream.  These are a special treat, only in small amounts. Eat plenty of vegetables.  Especially dark green, red, and orange vegetables. Aim for at least 3 servings a day. More is better! Include fruit in your daily diet.  Whole fruit is much healthier than fruit juice! Limit "white" bread, "white" pasta, "white" rice.   Choose "100% whole grain" products, brown or wild rice. Avoid fatty meats. Try "Meatless Monday" and choose eggs or beans one day a week.  When eating meat, choose lean meats like chicken, [place], and fish.  Grill, broil, or bake meats instead of frying, and eat poultry without the skin. Eat less salt.  Avoid frozen pizzas, frozen dinners and salty foods.  Use seasonings other than salt in cooking.  This can help blood pressure and keep you from swelling Beer, wine and liquor have calories.  If you can safely drink alcohol, limit to 1 drink per day for women, 2 drinks for men   Food Choices for Gastroesophageal Reflux Disease, Adult When you have gastroesophageal reflux disease (GERD), the foods you eat and your eating habits are very important. Choosing the right foods can help ease your discomfort. What guidelines do I need to follow?  Choose fruits, vegetables, whole grains, and low-fat dairy products.  Choose low-fat meat, fish, and poultry.  Limit fats such as oils, salad dressings, butter, nuts, and avocado.  Keep a food diary. This helps you identify foods that cause symptoms.  Avoid foods that cause symptoms. These may be  different for everyone.  Eat small meals often instead of 3 large meals a day.  Eat your meals slowly, in a place where you are relaxed.  Limit fried foods.  Cook foods using methods other than frying.  Avoid drinking alcohol.  Avoid drinking large amounts of liquids with your meals.  Avoid bending over or lying down until 2-3 hours after eating. What foods are not recommended? These are some foods and drinks that may make your symptoms worse: Vegetables  Tomatoes. Tomato juice. Tomato and spaghetti sauce. Chili peppers. Onion and garlic. Horseradish. Fruits  Oranges, grapefruit, and lemon (fruit and juice). Meats  High-fat meats, fish, and poultry. This includes hot dogs, ribs, ham, sausage, salami, and bacon. Dairy  Whole milk and chocolate milk. Sour cream. Cream. Butter. Ice cream. Cream cheese. Drinks  Coffee and tea. Bubbly (carbonated) drinks or energy drinks. Condiments  Hot sauce. Barbecue sauce. Sweets/Desserts  Chocolate and cocoa. Donuts. Peppermint and spearmint. Fats and Oils  High-fat foods. This includes [place] fries and potato chips. Other  Vinegar. Strong spices. This includes black pepper, white pepper, red pepper, cayenne, curry powder, cloves, ginger, and chili powder. The items listed above may not be a complete list of foods and drinks to avoid. Contact your dietitian for more information.  This information is not intended to replace advice given to you by your health care provider. Make sure you discuss any questions you have with your health care provider. Document Released: 11/17/2011 Document Revised:  10/24/2015 Document Reviewed: 03/22/2013 Elsevier Interactive Patient Education  2017 [employer].

## 2016-10-13 LAB — LIPID PANEL
CHOLESTEROL TOTAL: 142 mg/dL (ref 100–199)
Chol/HDL Ratio: 2 ratio (ref 0.0–4.4)
HDL: 71 mg/dL (ref >39)
LDL Calculated: 52 mg/dL (ref 0–99)
TRIGLYCERIDES: 94 mg/dL (ref 0–149)
VLDL Cholesterol Cal: 19 mg/dL (ref 5–40)

## 2016-10-13 LAB — COMPREHENSIVE METABOLIC PANEL
ALT: 27 IU/L (ref 0–32)
AST: 23 IU/L (ref 0–40)
Albumin/Globulin Ratio: 1.4 (ref 1.2–2.2)
Albumin: 4.1 g/dL (ref 3.5–5.5)
Alkaline Phosphatase: 125 IU/L — ABNORMAL HIGH (ref 39–117)
BILIRUBIN TOTAL: 0.3 mg/dL (ref 0.0–1.2)
BUN/Creatinine Ratio: 14 (ref 9–23)
BUN: 12 mg/dL (ref 6–24)
CHLORIDE: 105 mmol/L (ref 96–106)
CO2: 22 mmol/L (ref 18–29)
Calcium: 9.3 mg/dL (ref 8.7–10.2)
Creatinine, Ser: 0.87 mg/dL (ref 0.57–1.00)
GFR calc non Af Amer: 76 mL/min/{1.73_m2} (ref >59)
GFR, EST AFRICAN AMERICAN: 87 mL/min/{1.73_m2} (ref >59)
GLUCOSE: 146 mg/dL — AB (ref 65–99)
Globulin, Total: 3 g/dL (ref 1.5–4.5)
Potassium: 4.2 mmol/L (ref 3.5–5.2)
Sodium: 142 mmol/L (ref 134–144)
TOTAL PROTEIN: 7.1 g/dL (ref 6.0–8.5)

## 2016-10-13 LAB — HEPATITIS C ANTIBODY: Hep C Virus Ab: <0.1 s/co ratio (ref 0.0–0.9)

## 2016-10-13 LAB — CBC
HEMATOCRIT: 37.4 % (ref 34.0–46.6)
HEMOGLOBIN: 11.1 g/dL (ref 11.1–15.9)
MCH: 23.4 pg — ABNORMAL LOW (ref 26.6–33.0)
MCHC: 29.7 g/dL — AB (ref 31.5–35.7)
MCV: 79 fL (ref 79–97)
Platelets: 382 10*3/uL — ABNORMAL HIGH (ref 150–379)
RBC: 4.74 x10E6/uL (ref 3.77–5.28)
RDW: 18.5 % — ABNORMAL HIGH (ref 12.3–15.4)
WBC: 9.5 10*3/uL (ref 3.4–10.8)

## 2016-10-13 LAB — HIV ANTIBODY (ROUTINE TESTING W REFLEX): HIV Screen 4th Generation wRfx: NONREACTIVE

## 2016-10-13 NOTE — Addendum Note (Signed)
Addended by: Karle Plumber B on: 10/13/2016 01:46 PM   Modules accepted: Orders

## 2016-10-15 ENCOUNTER — Telehealth: Payer: Self-pay | Admitting: Internal Medicine

## 2016-10-15 NOTE — Telephone Encounter (Signed)
Called pt. And she was told that she needs to come in to sign a release of information to have her records released.

## 2016-10-16 ENCOUNTER — Telehealth: Payer: Self-pay

## 2016-10-16 ENCOUNTER — Encounter: Payer: Self-pay | Admitting: Internal Medicine

## 2016-10-16 DIAGNOSIS — D509 Iron deficiency anemia, unspecified: Secondary | ICD-10-CM | POA: Insufficient documentation

## 2016-10-16 NOTE — Telephone Encounter (Signed)
-----   Message from Ladell Pier, MD sent at 10/16/2016  1:33 PM EDT ----- Let pt know that she has early iron deficiency anemia and should start taking Iron supplement daily.  She can purchase OTC Ferrous Sulfate 64 mg and take one a day.

## 2016-10-16 NOTE — Telephone Encounter (Signed)
Clld pt - Advsd of lab results; provider's notations. Pt stated she understood and would start back taking her iron tablets.   Pt asked and gave verbal permission to have her lab results faxed to Linna Caprice, Kidney Transplant Coordinator at [phone]. Pt is beginning  the process of donating a kidney to her mother.  Faxed lab results.  Pt will need to complete correct medical release form at her next office visit.

## 2016-10-19 NOTE — Telephone Encounter (Signed)
MA informed patient of HEP C and HIV being negative. Patient is also aware of cholesterol, kidney and liver function being normal. Patient is aware of CBC showing an early sign of anemia and further iron level workup being completed. MA will contact the patient back with the further workup information. Medical Assistant left message on patient's home and cell voicemail. Voicemail states to give a call back to [place] with [facility] at [phone].

## 2016-10-19 NOTE — Telephone Encounter (Signed)
-----   Message from Ladell Pier, MD sent at 10/14/2016  1:30 PM EDT ----- Let pt know that her liver and kidney functions are normal.  Cholesterol level normal.  Hep C and HIV screens negative.  Blood count in low normal range ie early anemia.  I have asked the lab to add on iron studies to see if she is iron deficient.  Will let her know when resulted.

## 2016-10-29 LAB — SPECIMEN STATUS REPORT

## 2016-10-30 LAB — IRON AND TIBC
IRON SATURATION: 13 % — AB (ref 15–55)
Iron: 50 ug/dL (ref 27–159)
Total Iron Binding Capacity: 371 ug/dL (ref 250–450)
UIBC: 321 ug/dL (ref 131–425)

## 2016-10-30 LAB — SPECIMEN STATUS REPORT

## 2016-10-30 LAB — FERRITIN: FERRITIN: 26 ng/mL (ref 15–150)

## 2016-11-09 ENCOUNTER — Ambulatory Visit: Attending: Internal Medicine | Admitting: *Deleted

## 2016-11-09 ENCOUNTER — Ambulatory Visit

## 2016-11-09 DIAGNOSIS — Z111 Encounter for screening for respiratory tuberculosis: Secondary | ICD-10-CM | POA: Insufficient documentation

## 2016-11-09 NOTE — Progress Notes (Signed)
PPD Placement note  Lisa Galloway, 56 y.o. female is here today for placement of PPD test Reason for PPD test: employment Pt taken PPD test before: yes  Verified in allergy area and with patient that they are not allergic to the products PPD is made of (Phenol or Tween). : Yes Is patient taking any oral or IV steroid medication now or have they taken it in the last month? no Has the patient ever received the BCG vaccine?: no Has the patient been in recent contact with anyone known or suspected of having active TB disease?: no  Plan: PPD placed on 11/09/2016.  Patient advised to return for reading within 48-72 hours.

## 2016-11-11 LAB — TB SKIN TEST
INDURATION: 0 mm
TB SKIN TEST: NEGATIVE

## 2016-11-13 ENCOUNTER — Encounter: Payer: Self-pay | Admitting: Registered"

## 2016-11-13 ENCOUNTER — Encounter: Attending: Internal Medicine | Admitting: Registered"

## 2016-11-13 DIAGNOSIS — E669 Obesity, unspecified: Secondary | ICD-10-CM | POA: Diagnosis not present

## 2016-11-13 DIAGNOSIS — R7303 Prediabetes: Secondary | ICD-10-CM | POA: Diagnosis not present

## 2016-11-13 DIAGNOSIS — Z713 Dietary counseling and surveillance: Secondary | ICD-10-CM | POA: Insufficient documentation

## 2016-11-13 NOTE — Patient Instructions (Addendum)
Make sure to get in three meals per day. Try to have balanced meals (see handout).   Try to eat meals/snacks at a table and without the TV, phone, or tablet to reduce distractions so you can better listen to the body's hunger/satiety cues.   Try to get in regular physical activity. Try to get in at least 30 minutes of physical activity most days of the week.   Follow doctor's orders for iron supplement. Taking too much iron can have negative sides effects and reduce absorption of other nutrients.

## 2016-11-13 NOTE — Progress Notes (Signed)
Medical Nutrition Therapy:  Appt start time: 6767 end time:  1200.   Assessment:  Primary concerns today: Pt referred for obesity and prediabetes. Pt says she is hungry all the time and cannot get her weight down. Pt says she has tried many different diets to lose weight, but nothing has worked. Pt says she wants to donate a kidney to her mother and has been told she needs to work on managing her prediabetes before donating a kidney. Pt says in the past she took Phentermine and it helped reduce her food cravings. Pt says she gets especially hungry if she eats "junk." Pt says her doctor advised her to take iron and B12 supplements because she is in the early stages of anemia. Pt says she is taking 65 mg iron and sometimes she takes multiple at a time because she wants to "boost" her iron and feel better. Pt says she bought an exercise bike last month and has tried to exercise as much as possible.  Preferred Learning Style:  No preference indicated    Learning Readiness:   Ready  MEDICATIONS: See list.    DIETARY INTAKE:  Pt says she typically eats all day rather than having scheduled meals/snacks. Per pt, meals are usually eaten at a table but sometimes meals are eaten in front of the TV.   24-hr recall:  B ( AM): Croissant sandwich, coffee  Snk ( AM): Pt unsure.  L ( PM): Pt unsure Snk ( PM): Pt unsure.  D ( PM): Liver, gravy, sauteed onions, spinach  Snk ( PM): Pt unsure.  Beverages: coffee with cream and sugar, water  Usual physical activity: Pt says she tries to exercise on her stationary bike and treadmill.   Progress Towards Goal(s):  In progress.   Nutritional Diagnosis:  NI-5.11.1 Predicted suboptimal nutrient intake As related to unbalanced meals.  As evidenced by pt reported diet recall.    Intervention:  Nutrition counseling provided. Dietitian provided education regarding the relationship between insulin resistance and carbohydrate intake. Dietitian provided education  regarding balanced meals and the importance of eating three meals per day. Dietitian educated pt regarding mindful eating. Dietitian discussed with pt how eating regular, more balanced meals can help to increase satiety. Pt wanted to know if she should cut out carbohydrates from her diet. Dietitian educated pt regarding the importance of eating a consistent amount of carbohydrates at meals to provide adequate energy and to help with managing blood glucose levels. Dietitian encouraged pt to increase her physical activity and discussed how doing so can help reduce blood glucose levels. Dietitian advised pt to take her iron supplement as recommended by her doctor and to avoid taking extra pills as doing so can have adverse effects. Dietitian encouraged pt to focus on promoting her overall health (feeling better, regulating her blood sugar, etc.) rather than the number on the scales.   Goals:  Make sure to get in three meals per day. Try to have balanced meals (see handout).   Try to eat meals/snacks at a table and without the TV, phone, or tablet to reduce distractions so you can better listen to the body's hunger/satiety cues.   Try to get in regular physical activity. Try to get in at least 30 minutes of physical activity most days of the week.   Follow doctor's orders for iron supplement. Taking too much iron can have negative sides effects and reduce absorption of other nutrients.  Teaching Method Utilized:  Auditory  Handouts given during visit  include:  Low Carbohydrate Snack handout  Carbohydrate servings handout   Balanced plate with list of foods   Living Well with Diabetes   Barriers to learning/adherence to lifestyle change: None indicated.   Demonstrated degree of understanding via:  Teach Back   Monitoring/Evaluation:  Dietary intake, exercise, and body weight prn.

## 2017-12-13 ENCOUNTER — Ambulatory Visit

## 2017-12-14 ENCOUNTER — Ambulatory Visit: Attending: Internal Medicine | Admitting: *Deleted

## 2017-12-14 DIAGNOSIS — Z111 Encounter for screening for respiratory tuberculosis: Secondary | ICD-10-CM | POA: Insufficient documentation

## 2017-12-14 NOTE — Progress Notes (Signed)
PPD Placement note Lisa Galloway, 57 y.o. female is here today for placement of PPD test Reason for PPD test: employment Pt taken PPD test before: yes Verified in allergy area and with patient that they are not allergic to the products PPD is made of (Phenol or Tween). No Is patient taking any oral or IV steroid medication now or have they taken it in the last month? no Has the patient ever received the BCG vaccine?: no Has the patient been in recent contact with anyone known or suspected of having active TB disease?: no   PPD placed on 12/14/2017.  Patient advised to return for reading within 48-72 hours.

## 2017-12-17 ENCOUNTER — Ambulatory Visit: Attending: Family Medicine | Admitting: *Deleted

## 2017-12-17 DIAGNOSIS — Z111 Encounter for screening for respiratory tuberculosis: Secondary | ICD-10-CM | POA: Diagnosis not present

## 2017-12-17 DIAGNOSIS — Z7689 Persons encountering health services in other specified circumstances: Secondary | ICD-10-CM | POA: Diagnosis present

## 2017-12-17 LAB — TB SKIN TEST
INDURATION: 0 mm
TB SKIN TEST: NEGATIVE

## 2017-12-17 NOTE — Progress Notes (Signed)
PPD Reading Note  PPD read and results entered in EpicCare.  Result: 0 mm induration.  Interpretation: negative  Allergic reaction: no

## 2018-01-03 ENCOUNTER — Ambulatory Visit: Admitting: Internal Medicine

## 2018-08-12 ENCOUNTER — Encounter (HOSPITAL_COMMUNITY): Payer: Self-pay | Admitting: Emergency Medicine

## 2018-08-12 ENCOUNTER — Emergency Department (HOSPITAL_COMMUNITY)
Admission: EM | Admit: 2018-08-12 | Discharge: 2018-08-12 | Disposition: A | Discharge status: Home / Self Care | Attending: Emergency Medicine | Admitting: Emergency Medicine

## 2018-08-12 ENCOUNTER — Emergency Department (HOSPITAL_COMMUNITY)

## 2018-08-12 ENCOUNTER — Other Ambulatory Visit: Payer: Self-pay

## 2018-08-12 DIAGNOSIS — R0789 Other chest pain: Secondary | ICD-10-CM | POA: Diagnosis not present

## 2018-08-12 DIAGNOSIS — J45909 Unspecified asthma, uncomplicated: Secondary | ICD-10-CM | POA: Diagnosis not present

## 2018-08-12 DIAGNOSIS — R091 Pleurisy: Secondary | ICD-10-CM

## 2018-08-12 DIAGNOSIS — R079 Chest pain, unspecified: Secondary | ICD-10-CM | POA: Diagnosis present

## 2018-08-12 DIAGNOSIS — R0781 Pleurodynia: Secondary | ICD-10-CM

## 2018-08-12 DIAGNOSIS — R7303 Prediabetes: Secondary | ICD-10-CM | POA: Insufficient documentation

## 2018-08-12 DIAGNOSIS — Z79899 Other long term (current) drug therapy: Secondary | ICD-10-CM | POA: Diagnosis not present

## 2018-08-12 LAB — BASIC METABOLIC PANEL
Anion gap: 8 (ref 5–15)
BUN: 15 mg/dL (ref 6–20)
CO2: 24 mmol/L (ref 22–32)
CREATININE: 0.79 mg/dL (ref 0.44–1.00)
Calcium: 9.1 mg/dL (ref 8.9–10.3)
Chloride: 105 mmol/L (ref 98–111)
GFR calc Af Amer: >60 mL/min (ref >60)
GFR calc non Af Amer: >60 mL/min (ref >60)
Glucose, Bld: 124 mg/dL — ABNORMAL HIGH (ref 70–99)
Potassium: 3.9 mmol/L (ref 3.5–5.1)
Sodium: 137 mmol/L (ref 135–145)

## 2018-08-12 LAB — CBC
HCT: 38 % (ref 36.0–46.0)
Hemoglobin: 11.4 g/dL — ABNORMAL LOW (ref 12.0–15.0)
MCH: 23.8 pg — ABNORMAL LOW (ref 26.0–34.0)
MCHC: 30 g/dL (ref 30.0–36.0)
MCV: 79.2 fL — ABNORMAL LOW (ref 80.0–100.0)
NRBC: 0 % (ref 0.0–0.2)
Platelets: 397 10*3/uL (ref 150–400)
RBC: 4.8 MIL/uL (ref 3.87–5.11)
RDW: 18.4 % — AB (ref 11.5–15.5)
WBC: 9.6 10*3/uL (ref 4.0–10.5)

## 2018-08-12 LAB — I-STAT BETA HCG BLOOD, ED (MC, WL, AP ONLY): I-stat hCG, quantitative: <5 m[IU]/mL (ref <5)

## 2018-08-12 LAB — I-STAT TROPONIN, ED: Troponin i, poc: 0 ng/mL (ref 0.00–0.08)

## 2018-08-12 MED ORDER — SODIUM CHLORIDE 0.9% FLUSH: 3.0000 mL | Freq: Once | INTRAVENOUS | Status: DC

## 2018-08-12 MED ORDER — KETOROLAC TROMETHAMINE 30 MG/ML IJ SOLN
15.0000 mg | Freq: Once | INTRAMUSCULAR | Status: AC
  Administered 2018-08-12: 15 mg via INTRAMUSCULAR
  Filled 2018-08-12: qty 1

## 2018-08-12 MED ORDER — METHOCARBAMOL 500 MG PO TABS: 500.0000 mg | ORAL_TABLET | Freq: Two times a day (BID) | ORAL | 0 refills | Status: DC

## 2018-08-12 NOTE — ED Notes (Signed)
Patient refused to have a EKG done, The Nurse Nicki Reaper was informed.

## 2018-08-12 NOTE — Discharge Instructions (Signed)
Please take medication as prescribed for pain Please follow up with your PCP regarding symptoms as well as yearly check up given you haven't been recently  Return to the ED for any worsening pain, shortness of breath, fever, chills, nausea, vomiting.

## 2018-08-12 NOTE — ED Triage Notes (Signed)
Pt reports chest pain for 3 days. Pt believes this could be her pleurisy acting up.

## 2018-08-12 NOTE — ED Provider Notes (Signed)
[facility] Provider Note   CSN: [id number] Arrival date & time: 08/12/18  6378    History   Chief Complaint Chief Complaint  Patient presents with  . Chest Pain  . Pleurisy    HPI Lisa Galloway is a 58 y.o. female.     Pt presents to the ED complaining of gradual onset, constant, waxing and waning, pressure like chest pain x 3-4 days. Pt has hx of pleurisy and reports this feels similar. She recently came back from [place] after attending her mother's funeral and states she had URI like symptoms including rhinorrhea and cough that has since subsided. Pt woke up with this chest pain a couple of days ago and states it has been present since. She also endorses right mid back pain. Pt has been taking Aleve with mild relief. No hx of MI. Pt has never had a stress test or cardiac cath. Denies shortness of breath, nausea, vomiting, diaphoresis, leg swelling, or any other associated symptoms.      Past Medical History:  Diagnosis Date  . Allergy    HEY FEVER  . Anemia   . Asthma   . GERD (gastroesophageal reflux disease)   . Hayfever     Patient Active Problem List   Diagnosis Date Noted  . Iron deficiency anemia 10/16/2016  . Large hiatal hernia 10/12/2016  . Intermittent asthma 10/12/2016  . GERD without esophagitis 10/08/2016  . Prediabetes 10/08/2016  . Obesity (BMI 30-39.9) 10/08/2016    History reviewed. No pertinent surgical history.   OB History   No obstetric history on file.      Home Medications    Prior to Admission medications   Medication Sig Start Date End Date Taking? Authorizing Provider  albuterol (PROAIR HFA) 108 (90 Base) MCG/ACT inhaler Inhale 2 puffs into the lungs every 6 (six) hours as needed for wheezing or shortness of breath. 10/12/16  Yes Ladell Pier, MD  diclofenac sodium (VOLTAREN) 1 % GEL Apply 2 g topically 4 (four) times daily. Patient taking differently: Apply 2 g topically 4 (four)  times daily as needed (pain).  11/19/15  Yes Maren Reamer, MD  esomeprazole (NEXIUM) 20 MG packet Take 20 mg by mouth 2 (two) times daily before a meal. 10/12/16  Yes Ladell Pier, MD  IRON PO Take 1 tablet by mouth daily.    Yes [provider]  Multiple Vitamin (MULTIVITAMIN WITH MINERALS) TABS tablet Take 1 tablet by mouth daily.   Yes [provider]  omeprazole (PRILOSEC) 20 MG capsule Take 20 mg by mouth 2 (two) times daily before a meal.   Yes [provider]  hydrocortisone 2.5 % cream Apply topically 2 (two) times daily. Patient not taking: Reported on 08/12/2018 10/12/16   Ladell Pier, MD  methocarbamol (ROBAXIN) 500 MG tablet Take 1 tablet (500 mg total) by mouth 2 (two) times daily. 08/12/18   Eustaquio Maize, PA-C    Family History Family History  Problem Relation Age of Onset  . Kidney disease Mother   . Diabetes Mother   . Hypertension Sister   . Diabetes Sister   . Hypertension Brother   . Diabetes Brother   . Alcoholism Brother   . Hypertension Sister   . Diabetes Sister   . Colon cancer Maternal Grandmother   . Cancer Maternal Grandmother   . Colon cancer Maternal Aunt   . Cancer Maternal Aunt   . Sleep apnea Other  Social History Social History   Tobacco Use  . Smoking status: Never Smoker  . Smokeless tobacco: Never Used  Substance Use Topics  . Alcohol use: No  . Drug use: No     Allergies   Ginger; Latex; and Oxycodone   Review of Systems Review of Systems  Constitutional: Negative for chills and fever.  HENT: Positive for rhinorrhea (resolved).   Eyes: Negative for visual disturbance.  Respiratory: Positive for cough (resolved). Negative for shortness of breath.   Cardiovascular: Positive for chest pain. Negative for palpitations and leg swelling.  Gastrointestinal: Negative for abdominal pain, nausea and vomiting.  Musculoskeletal: Positive for back pain. Negative for neck pain and neck stiffness.   Skin: Negative for rash.  Allergic/Immunologic: Negative for immunocompromised state.  Neurological: Negative for dizziness and light-headedness.     Physical Exam Updated Vital Signs BP 109/76   Pulse 65   Temp 97.7 F (36.5 C) (Oral)   Resp 17   Ht 5\' 5"  (1.651 m)   Wt 90.7 kg   LMP  (Exact Date)   SpO2 98%   BMI 33.28 kg/m   Physical Exam Vitals signs and nursing note reviewed.  Constitutional:      Appearance: She is not ill-appearing.  HENT:     Head: Normocephalic and atraumatic.  Eyes:     Conjunctiva/sclera: Conjunctivae normal.  Neck:     Musculoskeletal: Normal range of motion and neck supple.  Cardiovascular:     Rate and Rhythm: Normal rate and regular rhythm.     Pulses:          Dorsalis pedis pulses are 2+ on the right side and 2+ on the left side.     Heart sounds: Normal heart sounds.  Pulmonary:     Effort: Pulmonary effort is normal.     Breath sounds: Normal breath sounds. No decreased breath sounds, wheezing, rhonchi or rales.  Chest:     Chest wall: Tenderness present.     Comments: Chest wall diffusely tender to palpation specifically below breast line Abdominal:     Palpations: Abdomen is soft.     Tenderness: There is no abdominal tenderness.  Musculoskeletal:     Comments: TTP along right thoracic paraspinal muscles and along scapula; no C, T, or L spine midline tenderness.   Skin:    General: Skin is warm and dry.  Neurological:     Mental Status: She is alert.      ED Treatments / Results  Labs (all labs ordered are listed, but only abnormal results are displayed) Labs Reviewed  BASIC METABOLIC PANEL - Abnormal; Notable for the following components:      Result Value   Glucose, Bld 124 (*)    All other components within normal limits  CBC - Abnormal; Notable for the following components:   Hemoglobin 11.4 (*)    MCV 79.2 (*)    MCH 23.8 (*)    RDW 18.4 (*)    All other components within normal limits  I-STAT TROPONIN, ED   I-STAT BETA HCG BLOOD, ED ([facility], [facility], [facility] ONLY)    EKG EKG Interpretation  Date/Time:  Friday August 12 2018 05:50:42 EDT Ventricular Rate:  66 PR Interval:    QRS Duration: 100 QT Interval:  430 QTC Calculation: 451 R Axis:   -11 Text Interpretation:  Sinus rhythm Low voltage, precordial leads Borderline T abnormalities, inferior leads No significant change since last tracing Confirmed by Merrily Pew [id number]) on 08/12/2018 5:55:35 AM  Radiology Dg Chest 2 View  Result Date: 08/12/2018 CLINICAL DATA:  Initial evaluation for acute chest pain. EXAM: CHEST - 2 VIEW COMPARISON:  None. FINDINGS: Mild cardiomegaly.  Mediastinal silhouette within normal limits. The lungs are normally inflated. No airspace consolidation, pleural effusion, or pulmonary edema is identified. There is no pneumothorax. No acute osseous abnormality identified. IMPRESSION: 1. No active cardiopulmonary disease. 2. Mild cardiomegaly. Electronically Signed   By: Jeannine Boga M.D.   On: 08/12/2018 06:33    Procedures Procedures (including critical care time)  Medications Ordered in ED Medications  sodium chloride flush (NS) 0.9 % injection 3 mL (3 mLs Intravenous Not Given 08/12/18 0601)  ketorolac (TORADOL) 30 MG/ML injection 15 mg (15 mg Intramuscular Given 08/12/18 0708)     Initial Impression / Assessment and Plan / ED Course  I have reviewed the triage vital signs and the nursing notes.  Pertinent labs & imaging results that were available during my care of the patient were reviewed by me and considered in my medical decision making (see chart for details).  Clinical Course as of Aug 12 743  Fri Aug 12, 2018  0711 Patient seen by myself with orienting PA Eustaquio Maize.  Patient with 3 to 4 days of reproducible chest wall pain that is worse with movement.  Pain is not pleuritic or exertional.  No cardiac history, no PE risk factors, vitals normal and work-up very reassuring.  Will treat with NSAIDs and  muscle relaxers for chest wall pain and have patient follow-up with PCP.   [KF]    Clinical Course User Index [KF] Jacqlyn Larsen, PA-C   Presents with diffuse chest pain x 3-4 days. Has hx of pleurisy and states this feels similar. Cold like symptoms about 3 weeks ago prior to pain beginning. Traveled to [place] 3 weeks ago but besides that no recent prolonged travel. No hx DVT/PE. Never smoker. No estrogen therapy. Low suspicion for PE. ACS workup initiated - EKG normal; CXR negative; Troponin negative. Heart pathway score 1. CBC and BMP within normal limits. Able to reproduce pain with palpation; suspect pleurisy from previous URI. Will give 15 mg IV Toradol in ED and discharge home with close follow up with PCP.   7:19 AM Benedetto Goad PA-C evaluated patient as well and agrees with plan. Will discharge home with Robaxin for symptomatic relief. Pt should follow up with PCP regarding symptoms.           Final Clinical Impressions(s) / ED Diagnoses   Final diagnoses:  Pleurisy  Pleuritic chest pain    ED Discharge Orders         Ordered    methocarbamol (ROBAXIN) 500 MG tablet  2 times daily     08/12/18 0717           Eustaquio Maize, PA-C 08/12/18 0745    Mesner, Corene Cornea, MD 08/12/18 [phone]

## 2018-08-12 NOTE — ED Notes (Signed)
Patient verbalizes understanding of discharge instructions. Opportunity for questioning and answers were provided. Armband removed by staff, pt discharged from ED.  

## 2018-11-15 ENCOUNTER — Encounter (HOSPITAL_COMMUNITY): Payer: Self-pay | Admitting: Emergency Medicine

## 2018-11-15 ENCOUNTER — Other Ambulatory Visit: Payer: Self-pay

## 2018-11-15 ENCOUNTER — Emergency Department (HOSPITAL_COMMUNITY)

## 2018-11-15 ENCOUNTER — Emergency Department (HOSPITAL_COMMUNITY)
Admission: EM | Admit: 2018-11-15 | Discharge: 2018-11-15 | Disposition: A | Discharge status: Home / Self Care | Attending: Emergency Medicine | Admitting: Emergency Medicine

## 2018-11-15 DIAGNOSIS — Z79899 Other long term (current) drug therapy: Secondary | ICD-10-CM | POA: Diagnosis not present

## 2018-11-15 DIAGNOSIS — Z91040 Latex allergy status: Secondary | ICD-10-CM | POA: Diagnosis not present

## 2018-11-15 DIAGNOSIS — M25551 Pain in right hip: Secondary | ICD-10-CM | POA: Diagnosis not present

## 2018-11-15 DIAGNOSIS — S8002XA Contusion of left knee, initial encounter: Secondary | ICD-10-CM | POA: Diagnosis not present

## 2018-11-15 DIAGNOSIS — M79641 Pain in right hand: Secondary | ICD-10-CM | POA: Diagnosis not present

## 2018-11-15 DIAGNOSIS — Y92129 Unspecified place in nursing home as the place of occurrence of the external cause: Secondary | ICD-10-CM | POA: Diagnosis not present

## 2018-11-15 DIAGNOSIS — Y990 Civilian activity done for income or pay: Secondary | ICD-10-CM | POA: Insufficient documentation

## 2018-11-15 DIAGNOSIS — M545 Low back pain: Secondary | ICD-10-CM | POA: Insufficient documentation

## 2018-11-15 DIAGNOSIS — J45909 Unspecified asthma, uncomplicated: Secondary | ICD-10-CM | POA: Diagnosis not present

## 2018-11-15 DIAGNOSIS — W010XXA Fall on same level from slipping, tripping and stumbling without subsequent striking against object, initial encounter: Secondary | ICD-10-CM | POA: Diagnosis not present

## 2018-11-15 DIAGNOSIS — S8992XA Unspecified injury of left lower leg, initial encounter: Secondary | ICD-10-CM | POA: Diagnosis present

## 2018-11-15 DIAGNOSIS — W19XXXA Unspecified fall, initial encounter: Secondary | ICD-10-CM

## 2018-11-15 DIAGNOSIS — Y9389 Activity, other specified: Secondary | ICD-10-CM | POA: Insufficient documentation

## 2018-11-15 MED ORDER — NAPROXEN 500 MG PO TABS: 500.0000 mg | ORAL_TABLET | Freq: Two times a day (BID) | ORAL | 0 refills | Status: DC | PRN

## 2018-11-15 MED ORDER — IBUPROFEN 400 MG PO TABS
400.0000 mg | ORAL_TABLET | Freq: Once | ORAL | Status: AC
  Administered 2018-11-15: 400 mg via ORAL
  Filled 2018-11-15: qty 1

## 2018-11-15 NOTE — ED Triage Notes (Signed)
Patient states she was at work tonight when she slipped and fell from water spilled on the floor. Patient states she is having right leg, knee, hand and back pain.

## 2018-11-15 NOTE — ED Provider Notes (Signed)
Day Provider Note   CSN: [id number] Arrival date & time: 11/15/18  0208     History   Chief Complaint Chief Complaint  Patient presents with  . Fall    right leg/back pain    HPI Lisa Galloway is a 58 y.o. female.     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  States she landed on her right knee and her right low back.  She also injured her right hand when she fell.  Denies hitting her head or losing consciousness.  Denies any preceding neck, back or knee problems.  Complains of pain diffusely to her right knee and her right low back.  There is no numbness, tingling, bowel or bladder incontinence.  No focal weakness.  No neck pain or upper back pain.  No headache.  She denies taking any blood thinners.  No chest pain or shortness of breath.  No abdominal pain.  She did not take any medication prior to arrival.  The history is provided by the patient.  Fall Pertinent negatives include no chest pain, no abdominal pain, no headaches and no shortness of breath.    Past Medical History:  Diagnosis Date  . Allergy    HEY FEVER  . Anemia   . Asthma   . GERD (gastroesophageal reflux disease)   . Hayfever     Patient Active Problem List   Diagnosis Date Noted  . Iron deficiency anemia 10/16/2016  . Large hiatal hernia 10/12/2016  . Intermittent asthma 10/12/2016  . GERD without esophagitis 10/08/2016  . Prediabetes 10/08/2016  . Obesity (BMI 30-39.9) 10/08/2016    History reviewed. No pertinent surgical history.   OB History   No obstetric history on file.      Home Medications    Prior to Admission medications   Medication Sig Start Date End Date Taking? Authorizing Provider  albuterol (PROAIR HFA) 108 (90 Base) MCG/ACT inhaler Inhale 2 puffs into the lungs every 6 (six) hours as needed for wheezing or shortness of breath. 10/12/16   Ladell Pier, MD  diclofenac sodium (VOLTAREN) 1 %  GEL Apply 2 g topically 4 (four) times daily. Patient taking differently: Apply 2 g topically 4 (four) times daily as needed (pain).  11/19/15   Maren Reamer, MD  esomeprazole (NEXIUM) 20 MG packet Take 20 mg by mouth 2 (two) times daily before a meal. 10/12/16   Ladell Pier, MD  hydrocortisone 2.5 % cream Apply topically 2 (two) times daily. Patient not taking: Reported on 08/12/2018 10/12/16   Ladell Pier, MD  IRON PO Take 1 tablet by mouth daily.     [provider]  methocarbamol (ROBAXIN) 500 MG tablet Take 1 tablet (500 mg total) by mouth 2 (two) times daily. 08/12/18   Eustaquio Maize, PA-C  Multiple Vitamin (MULTIVITAMIN WITH MINERALS) TABS tablet Take 1 tablet by mouth daily.    [provider]  omeprazole (PRILOSEC) 20 MG capsule Take 20 mg by mouth 2 (two) times daily before a meal.    [provider]    Family History Family History  Problem Relation Age of Onset  . Kidney disease Mother   . Diabetes Mother   . Hypertension Sister   . Diabetes Sister   . Hypertension Brother   . Diabetes Brother   . Alcoholism Brother   . Hypertension Sister   . Diabetes Sister   . Colon cancer Maternal  Grandmother   . Cancer Maternal Grandmother   . Colon cancer Maternal Aunt   . Cancer Maternal Aunt   . Sleep apnea Other     Social History Social History   Tobacco Use  . Smoking status: Never Smoker  . Smokeless tobacco: Never Used  Substance Use Topics  . Alcohol use: No  . Drug use: No     Allergies   Ginger, Latex, and Oxycodone   Review of Systems Review of Systems  Constitutional: Negative for activity change, appetite change and fever.  HENT: Negative for congestion and rhinorrhea.   Eyes: Negative for visual disturbance.  Respiratory: Negative for cough, chest tightness and shortness of breath.   Cardiovascular: Negative for chest pain.  Gastrointestinal: Negative for abdominal pain, nausea and vomiting.   Genitourinary: Negative for dysuria and hematuria.  Musculoskeletal: Positive for arthralgias, back pain and myalgias.  Skin: Negative for rash.  Neurological: Negative for dizziness, weakness, light-headedness and headaches.    all other systems are negative except as noted in the HPI and PMH.    Physical Exam Updated Vital Signs BP (!) 106/94 (BP Location: Left Arm)   Pulse 79   Temp 97.9 F (36.6 C) (Oral)   Resp 18   Ht 5\' 5"  (1.651 m)   Wt 90.7 kg   SpO2 100%   BMI 33.28 kg/m   Physical Exam Vitals signs and nursing note reviewed.  Constitutional:      General: She is not in acute distress.    Appearance: She is well-developed. She is obese.  HENT:     Head: Normocephalic and atraumatic.     Mouth/Throat:     Pharynx: No oropharyngeal exudate.  Eyes:     Conjunctiva/sclera: Conjunctivae normal.     Pupils: Pupils are equal, round, and reactive to light.  Neck:     Musculoskeletal: Normal range of motion and neck supple.     Comments: No C-spine tenderness Cardiovascular:     Rate and Rhythm: Normal rate and regular rhythm.     Heart sounds: Normal heart sounds. No murmur.  Pulmonary:     Effort: Pulmonary effort is normal. No respiratory distress.     Breath sounds: Normal breath sounds.  Abdominal:     Palpations: Abdomen is soft.     Tenderness: There is no abdominal tenderness. There is no guarding or rebound.  Musculoskeletal: Normal range of motion.        General: Tenderness and signs of injury present. No swelling or deformity.     Comments: Paraspinal lumbar tenderness without step-off or deformity.  Right knee is diffusely tender to palpation without deformity or ligament laxity.  Able to flex and extend.  Able to lift leg and keep knee extended.  Intact DP and PT pulse. No discreet tenderness to palpation of tibial plateau  Hand appears normal to inspection without deformity.  Full range of motion of all fingers.  Patient complains of pain along her  third digit.  5/5 strength in bilateral lower extremities. Ankle plantar and dorsiflexion intact. Great toe extension intact bilaterally. +2 DP and PT pulses. Antalgic gait.  Skin:    General: Skin is warm.     Capillary Refill: Capillary refill takes less than 2 seconds.  Neurological:     General: No focal deficit present.     Mental Status: She is alert and oriented to person, place, and time. Mental status is at baseline.     Cranial Nerves: No cranial nerve deficit.  Motor: No abnormal muscle tone.     Coordination: Coordination normal.     Comments: No ataxia on finger to nose bilaterally. No pronator drift. 5/5 strength throughout. CN 2-12 intact.Equal grip strength. Sensation intact.   Psychiatric:        Behavior: Behavior normal.      ED Treatments / Results  Labs (all labs ordered are listed, but only abnormal results are displayed) Labs Reviewed - No data to display  EKG    Radiology Dg Lumbar Spine Complete  Result Date: 11/15/2018 CLINICAL DATA:  Fall, low back pain EXAM: LUMBAR SPINE - COMPLETE 4+ VIEW COMPARISON:  None. FINDINGS: Degenerative facet disease in the lower lumbar spine. 4 mm of anterolisthesis of L5 on S1. No fracture. Disc spaces maintained. SI joints symmetric and unremarkable. IMPRESSION: Degenerative disc disease in the lower lumbar spine. No acute bony abnormality. Electronically Signed   By: Rolm Baptise M.D.   On: 11/15/2018 03:36   Dg Knee Complete 4 Views Right  Result Date: 11/15/2018 CLINICAL DATA:  Fall.  Right knee pain EXAM: RIGHT KNEE - COMPLETE 4+ VIEW COMPARISON:  None. FINDINGS: Degenerative changes in the right knee with joint space narrowing and spurring most notable in the medial and patellofemoral compartments. No acute bony abnormality. Specifically, no fracture, subluxation, or dislocation. No joint effusion. IMPRESSION: Mild osteoarthritis.  No acute bony abnormality. Electronically Signed   By: Rolm Baptise M.D.   On:  11/15/2018 03:35   Dg Hand Complete Right  Result Date: 11/15/2018 CLINICAL DATA:  Fall, right hand pain EXAM: RIGHT HAND - COMPLETE 3+ VIEW COMPARISON:  None. FINDINGS: No acute bony abnormality. Specifically, no fracture, subluxation, or dislocation. Joint spaces maintained. Soft tissues unremarkable. IMPRESSION: No acute bony abnormality. Electronically Signed   By: Rolm Baptise M.D.   On: 11/15/2018 03:37   Dg Hip Unilat W Or Wo Pelvis 2-3 Views Right  Result Date: 11/15/2018 CLINICAL DATA:  Fall, right hip pain EXAM: DG HIP (WITH OR WITHOUT PELVIS) 2-3V RIGHT COMPARISON:  None. FINDINGS: There is no evidence of hip fracture or dislocation. There is no evidence of arthropathy or other focal bone abnormality. IMPRESSION: Negative. Electronically Signed   By: Rolm Baptise M.D.   On: 11/15/2018 03:35    Procedures Procedures (including critical care time)  Medications Ordered in ED Medications  ibuprofen (ADVIL) tablet 400 mg (has no administration in time range)     Initial Impression / Assessment and Plan / ED Course  I have reviewed the triage vital signs and the nursing notes.  Pertinent labs & imaging results that were available during my care of the patient were reviewed by me and considered in my medical decision making (see chart for details).       Mechanical fall with knee and low back pain. no head injury.  No neck pain.  Low suspicion for cord compression or cauda equina.  Patient neurovascularly intact.  X-rays are negative for fracture.  Patient able to ambulate.  She is given a knee immobilizer and crutches. She has no tibial plateau tenderness to suggest need for further imaging.  Discussed rest, NSAIDs, ice, PCP and orthopedic follow-up.  Return precautions discussed.  Final Clinical Impressions(s) / ED Diagnoses   Final diagnoses:  Fall, initial encounter  Contusion of left knee, initial encounter    ED Discharge Orders    None       Ethie Curless,  Annie Main, MD 11/15/18 [phone]

## 2018-11-15 NOTE — ED Notes (Signed)
Patient transported to X-ray 

## 2018-11-15 NOTE — Discharge Instructions (Signed)
Your Xrays are negative. Take the inflammatories as prescribed.  Wear the knee immobilizer.  Keep your leg elevated and use ice.  Follow-up with your doctor for recheck.  Return to the ED with worsening pain, weakness, numbness or other concerns.

## 2018-11-15 NOTE — ED Notes (Signed)
Patient given discharge instructions. Patient unable to sign for discharge.

## 2018-12-06 ENCOUNTER — Encounter: Payer: Self-pay | Admitting: Internal Medicine

## 2018-12-06 ENCOUNTER — Ambulatory Visit: Payer: Self-pay | Attending: Internal Medicine | Admitting: Internal Medicine

## 2018-12-06 ENCOUNTER — Other Ambulatory Visit: Payer: Self-pay

## 2018-12-06 VITALS — BP 111/77 | HR 91 | Temp 98.2°F | Resp 18 | Ht 65.0 in | Wt 219.0 lb

## 2018-12-06 DIAGNOSIS — E669 Obesity, unspecified: Secondary | ICD-10-CM

## 2018-12-06 DIAGNOSIS — M25561 Pain in right knee: Secondary | ICD-10-CM

## 2018-12-06 DIAGNOSIS — R7303 Prediabetes: Secondary | ICD-10-CM

## 2018-12-06 DIAGNOSIS — K219 Gastro-esophageal reflux disease without esophagitis: Secondary | ICD-10-CM

## 2018-12-06 DIAGNOSIS — M79641 Pain in right hand: Secondary | ICD-10-CM

## 2018-12-06 LAB — POCT GLYCOSYLATED HEMOGLOBIN (HGB A1C): Hemoglobin A1C: 6.4 % — AB (ref 4.0–5.6)

## 2018-12-06 LAB — GLUCOSE, POCT (MANUAL RESULT ENTRY): POC Glucose: 137 mg/dl — AB (ref 70–99)

## 2018-12-06 MED ORDER — METFORMIN HCL 500 MG PO TABS: 500.0000 mg | ORAL_TABLET | Freq: Every day | ORAL | 6 refills | Status: DC

## 2018-12-06 MED ORDER — OMEPRAZOLE 20 MG PO CPDR: 20.0000 mg | DELAYED_RELEASE_CAPSULE | Freq: Two times a day (BID) | ORAL | 6 refills | Status: DC

## 2018-12-06 NOTE — Progress Notes (Signed)
Patient ID: Lisa Galloway, female    DOB: 10-May-1961  MRN: [id number]  CC: Fall   Subjective: Lisa Galloway is a 58 y.o. female who presents for UC visit Her concerns today include:  Hx pre-DM, obesity, mild intermittent asthma, GERD, large hiatal hernia.  Last seen 2018.  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  Floor was wet Fell on RT knee and RT hand tried to break fall.  Seen in ED same day and had x-rays done of the hand, knee, hip, and lumbar spine.  X-rays were negative for any acute findings.  Incidental finding of arthritis in the right knee.  Given Naprosyn to use as needed. Given work restriction of no lifting more than 10 lbs.  "With my hand being the way it is, I think I need the restriction a little longer."  Pt is lift handed.  Finds that when she uses the RT hand to help lift pts at work, the hands aches more.   Still having pain in RT hand more so the middle finger and knee + swelling in middle finger that has remained about the same since fall.  Had to remove her ring from this finger.   Pain in knee and swelling in knee getting better.  Now "able to walk up the stairs better without it hurting so bad."    Obesity/Pre-DM:  Admits she was not eating healthy. Snacks on junk foods and drinks sodas.  Eating out a lot which she attributes to recent move.  Just unpacked her boxes and plans to start cooking again.   Trying to get back on track.    GERD:  Increase heartburn recently.  Admits that she has been eating a lot of spicy [employer] and eating foods that are tomato based.  She has known history of large hiatal hernia.  She has been out of her PPI for quite some time.  Patient Active Problem List   Diagnosis Date Noted  . Iron deficiency anemia 10/16/2016  . Large hiatal hernia 10/12/2016  . Intermittent asthma 10/12/2016  . GERD without esophagitis 10/08/2016  . Prediabetes 10/08/2016  . Obesity (BMI 30-39.9) 10/08/2016     Current Outpatient Medications on File Prior to Visit  Medication Sig Dispense Refill  . albuterol (PROAIR HFA) 108 (90 Base) MCG/ACT inhaler Inhale 2 puffs into the lungs every 6 (six) hours as needed for wheezing or shortness of breath. 1 Inhaler 2  . hydrocortisone 2.5 % cream Apply topically 2 (two) times daily. 30 g 0  . IRON PO Take 1 tablet by mouth daily.     . Multiple Vitamin (MULTIVITAMIN WITH MINERALS) TABS tablet Take 1 tablet by mouth daily.    . naproxen (NAPROSYN) 500 MG tablet Take 1 tablet (500 mg total) by mouth 2 (two) times daily as needed. 30 tablet 0   No current facility-administered medications on file prior to visit.     Allergies  Allergen Reactions  . Ginger Hives    Possible reaction to ginger root  . Latex Itching  . Oxycodone Nausea And Vomiting and Other (See Comments)    Heart racing, flushed, "felt like a heart attack"    Social History   Socioeconomic History  . Marital status: Married    Spouse name: Not on file  . Number of children: 2  . Years of education: Not on file  . Highest education level: Not on file  Occupational History  .  Occupation: nurse  Social Needs  . Financial resource strain: Not on file  . Food insecurity    Worry: Not on file    Inability: Not on file  . Transportation needs    Medical: Not on file    Non-medical: Not on file  Tobacco Use  . Smoking status: Never Smoker  . Smokeless tobacco: Never Used  Substance and Sexual Activity  . Alcohol use: No  . Drug use: No  . Sexual activity: Yes  Lifestyle  . Physical activity    Days per week: Not on file    Minutes per session: Not on file  . Stress: Not on file  Relationships  . Social Herbalist on phone: Not on file    Gets together: Not on file    Attends religious service: Not on file    Active member of club or organization: Not on file    Attends meetings of clubs or organizations: Not on file    Relationship status: Not on file  .  Intimate partner violence    Fear of current or ex partner: Not on file    Emotionally abused: Not on file    Physically abused: Not on file    Forced sexual activity: Not on file  Other Topics Concern  . Not on file  Social History Narrative  . Not on file    Family History  Problem Relation Age of Onset  . Kidney disease Mother   . Diabetes Mother   . Hypertension Sister   . Diabetes Sister   . Hypertension Brother   . Diabetes Brother   . Alcoholism Brother   . Hypertension Sister   . Diabetes Sister   . Colon cancer Maternal Grandmother   . Cancer Maternal Grandmother   . Colon cancer Maternal Aunt   . Cancer Maternal Aunt   . Sleep apnea Other     History reviewed. No pertinent surgical history.  ROS: Review of Systems Negative except as stated above  PHYSICAL EXAM: BP 111/77 (BP Location: Left Arm, Patient Position: Sitting, Cuff Size: Normal)   Pulse 91   Temp 98.2 F (36.8 C) (Oral)   Resp 18   Ht 5\' 5"  (1.651 m)   Wt 219 lb (99.3 kg)   SpO2 97%   BMI 36.44 kg/m   Wt Readings from Last 3 Encounters:  12/06/18 219 lb (99.3 kg)  11/15/18 200 lb (90.7 kg)  08/12/18 200 lb (90.7 kg)    Physical Exam  General appearance - alert, well appearing, middle-aged obese female and in no distress Mental status - normal mood, behavior, speech, dress, motor activity, and thought processes Neck - supple, no significant adenopathy Chest - clear to auscultation, no wheezes, rales or rhonchi, symmetric air entry Heart - normal rate, regular rhythm, normal S1, S2, no murmurs, rubs, clicks or gallops Musculoskeletal -right knee: Large body habitus.  No significant swelling appreciated.  No point tenderness.  Good range of motion passive and active.  Right hand: Mild swelling of the third PIP joint.  Mild tenderness on palpation of this joint and the MCP joint of this finger Extremities -no lower extremity edema.  BS 137/A1C 6.4. CMP Latest Ref Rng & Units 08/12/2018  10/12/2016 07/19/2015  Glucose 70 - 99 mg/dL 124(H) 146(H) 121(H)  BUN 6 - 20 mg/dL 15 12 15   Creatinine 0.44 - 1.00 mg/dL 0.79 0.87 0.96  Sodium 135 - 145 mmol/L 137 142 141  Potassium 3.5 -  5.1 mmol/L 3.9 4.2 3.7  Chloride 98 - 111 mmol/L 105 105 106  CO2 22 - 32 mmol/L 24 22 27   Calcium 8.9 - 10.3 mg/dL 9.1 9.3 9.7  Total Protein 6.0 - 8.5 g/dL - 7.1 -  Total Bilirubin 0.0 - 1.2 mg/dL - 0.3 -  Alkaline Phos 39 - 117 IU/L - 125(H) -  AST 0 - 40 IU/L - 23 -  ALT 0 - 32 IU/L - 27 -   Lipid Panel     Component Value Date/Time   CHOL 142 10/12/2016 1544   TRIG 94 10/12/2016 1544   HDL 71 10/12/2016 1544   CHOLHDL 2.0 10/12/2016 1544   LDLCALC 52 10/12/2016 1544    CBC    Component Value Date/Time   WBC 9.6 08/12/2018 0540   RBC 4.80 08/12/2018 0540   HGB 11.4 (L) 08/12/2018 0540   HGB 11.1 10/12/2016 1544   HCT 38.0 08/12/2018 0540   HCT 37.4 10/12/2016 1544   PLT 397 08/12/2018 0540   PLT 382 (H) 10/12/2016 1544   MCV 79.2 (L) 08/12/2018 0540   MCV 79 10/12/2016 1544   MCH 23.8 (L) 08/12/2018 0540   MCHC 30.0 08/12/2018 0540   RDW 18.4 (H) 08/12/2018 0540   RDW 18.5 (H) 10/12/2016 1544    ASSESSMENT AND PLAN: 1. Acute pain of right knee Likely suffered contusion.  She is clinically better.  Patient with history of GERD.  I recommend Voltaren gel rather than Naprosyn.  However patient feels the Voltaren gel does not work as well.  Continue Naprosyn as needed  2. Pain of right hand Continue Naprosyn as needed.  Will give a letter with work restriction extended for 1 month of no lifting more than 10 pounds  3. Prediabetes 4. Obesity (BMI 30-39.9) -Discussed healthy eating habits.  Advised patient to eliminate sugary drinks from her diet, cut back on eating white carbohydrates, try to eat more white meat than red meat and try to do some form of moderate intensity exercise at least 3 to 4 days a week for 30 minutes -I recommend starting metformin. - metFORMIN  (GLUCOPHAGE) 500 MG tablet; Take 1 tablet (500 mg total) by mouth daily with breakfast.  Dispense: 30 tablet; Refill: 6 - HgB A1c - Glucose (CBG)  5. Gastroesophageal reflux disease without esophagitis GERD precautions given including types of foods to avoid, eating last meal at least 3 to 4 hours before laying down and sleeping with the head slightly elevated.  Restart omeprazole - omeprazole (PRILOSEC) 20 MG capsule; Take 1 capsule (20 mg total) by mouth 2 (two) times daily before a meal.  Dispense: 30 capsule; Refill: 6    Patient was given the opportunity to ask questions.  Patient verbalized understanding of the plan and was able to repeat key elements of the plan.   Orders Placed This Encounter  Procedures  . HgB A1c  . Glucose (CBG)     Requested Prescriptions   Signed Prescriptions Disp Refills  . omeprazole (PRILOSEC) 20 MG capsule 30 capsule 6    Sig: Take 1 capsule (20 mg total) by mouth 2 (two) times daily before a meal.  . metFORMIN (GLUCOPHAGE) 500 MG tablet 30 tablet 6    Sig: Take 1 tablet (500 mg total) by mouth daily with breakfast.    Return in about 3 months (around 03/08/2019).  Karle Plumber, MD, FACP

## 2018-12-06 NOTE — Patient Instructions (Signed)

## 2019-01-02 ENCOUNTER — Other Ambulatory Visit: Payer: Self-pay

## 2019-01-02 ENCOUNTER — Ambulatory Visit: Payer: Self-pay | Attending: Internal Medicine | Admitting: *Deleted

## 2019-01-02 DIAGNOSIS — Z111 Encounter for screening for respiratory tuberculosis: Secondary | ICD-10-CM

## 2019-01-02 NOTE — Progress Notes (Signed)
PPD Placement note Danae Chen, 58 y.o. female is here today for placement of PPD test. Reason for PPD test: Employment Tuberculin skin test applied to the left ventral forearm.  Pt taken PPD test before: yes Has the patient ever received the BCG vaccine?: No Has the patient been in recent contact with anyone known or suspected of having active TB disease?: No   PPD placed on 01/02/2019.   Patient advised to return for reading within 48-72 hours.

## 2019-01-03 ENCOUNTER — Ambulatory Visit (INDEPENDENT_AMBULATORY_CARE_PROVIDER_SITE_OTHER): Payer: Self-pay | Admitting: Primary Care

## 2019-01-05 LAB — TB SKIN TEST: TB Skin Test: NEGATIVE

## 2019-03-09 ENCOUNTER — Ambulatory Visit: Payer: Self-pay | Admitting: Internal Medicine

## 2019-04-05 ENCOUNTER — Emergency Department (HOSPITAL_COMMUNITY)

## 2019-04-05 ENCOUNTER — Encounter (HOSPITAL_COMMUNITY): Payer: Self-pay | Admitting: Emergency Medicine

## 2019-04-05 ENCOUNTER — Emergency Department (HOSPITAL_COMMUNITY)
Admission: EM | Admit: 2019-04-05 | Discharge: 2019-04-05 | Disposition: A | Discharge status: Home / Self Care | Attending: Emergency Medicine | Admitting: Emergency Medicine

## 2019-04-05 ENCOUNTER — Other Ambulatory Visit: Payer: Self-pay

## 2019-04-05 DIAGNOSIS — J45909 Unspecified asthma, uncomplicated: Secondary | ICD-10-CM | POA: Diagnosis not present

## 2019-04-05 DIAGNOSIS — Z7984 Long term (current) use of oral hypoglycemic drugs: Secondary | ICD-10-CM | POA: Diagnosis not present

## 2019-04-05 DIAGNOSIS — R0602 Shortness of breath: Secondary | ICD-10-CM | POA: Diagnosis not present

## 2019-04-05 DIAGNOSIS — R350 Frequency of micturition: Secondary | ICD-10-CM | POA: Diagnosis not present

## 2019-04-05 DIAGNOSIS — R0789 Other chest pain: Secondary | ICD-10-CM | POA: Diagnosis present

## 2019-04-05 DIAGNOSIS — R05 Cough: Secondary | ICD-10-CM | POA: Insufficient documentation

## 2019-04-05 DIAGNOSIS — Z91040 Latex allergy status: Secondary | ICD-10-CM | POA: Insufficient documentation

## 2019-04-05 DIAGNOSIS — N898 Other specified noninflammatory disorders of vagina: Secondary | ICD-10-CM

## 2019-04-05 DIAGNOSIS — Z79899 Other long term (current) drug therapy: Secondary | ICD-10-CM | POA: Diagnosis not present

## 2019-04-05 LAB — URINALYSIS, ROUTINE W REFLEX MICROSCOPIC
Bilirubin Urine: NEGATIVE
Glucose, UA: NEGATIVE mg/dL
Hgb urine dipstick: NEGATIVE
Ketones, ur: NEGATIVE mg/dL
Nitrite: NEGATIVE
Protein, ur: NEGATIVE mg/dL
Specific Gravity, Urine: 1.024 (ref 1.005–1.030)
pH: 5 (ref 5.0–8.0)

## 2019-04-05 LAB — BASIC METABOLIC PANEL
Anion gap: 11 (ref 5–15)
BUN: 11 mg/dL (ref 6–20)
CO2: 24 mmol/L (ref 22–32)
Calcium: 9.1 mg/dL (ref 8.9–10.3)
Chloride: 105 mmol/L (ref 98–111)
Creatinine, Ser: 0.92 mg/dL (ref 0.44–1.00)
GFR calc Af Amer: >60 mL/min (ref >60)
GFR calc non Af Amer: >60 mL/min (ref >60)
Glucose, Bld: 113 mg/dL — ABNORMAL HIGH (ref 70–99)
Potassium: 4.3 mmol/L (ref 3.5–5.1)
Sodium: 140 mmol/L (ref 135–145)

## 2019-04-05 LAB — CBC
HCT: 38.2 % (ref 36.0–46.0)
Hemoglobin: 11.6 g/dL — ABNORMAL LOW (ref 12.0–15.0)
MCH: 24 pg — ABNORMAL LOW (ref 26.0–34.0)
MCHC: 30.4 g/dL (ref 30.0–36.0)
MCV: 79.1 fL — ABNORMAL LOW (ref 80.0–100.0)
Platelets: 370 10*3/uL (ref 150–400)
RBC: 4.83 MIL/uL (ref 3.87–5.11)
RDW: 18.5 % — ABNORMAL HIGH (ref 11.5–15.5)
WBC: 8.3 10*3/uL (ref 4.0–10.5)
nRBC: 0 % (ref 0.0–0.2)

## 2019-04-05 LAB — WET PREP, GENITAL
Clue Cells Wet Prep HPF POC: NONE SEEN
Sperm: NONE SEEN
Trich, Wet Prep: NONE SEEN
Yeast Wet Prep HPF POC: NONE SEEN

## 2019-04-05 LAB — TROPONIN I (HIGH SENSITIVITY)
Troponin I (High Sensitivity): <2 ng/L (ref <18)
Troponin I (High Sensitivity): <2 ng/L (ref <18)

## 2019-04-05 LAB — I-STAT BETA HCG BLOOD, ED (MC, WL, AP ONLY): I-stat hCG, quantitative: <5 m[IU]/mL (ref <5)

## 2019-04-05 LAB — D-DIMER, QUANTITATIVE: D-Dimer, Quant: 0.52 ug/mL-FEU — ABNORMAL HIGH (ref 0.00–0.50)

## 2019-04-05 MED ORDER — SODIUM CHLORIDE 0.9% FLUSH: 3.0000 mL | Freq: Once | INTRAVENOUS | Status: DC

## 2019-04-05 MED ORDER — METRONIDAZOLE 500 MG PO TABS: 500.0000 mg | ORAL_TABLET | Freq: Two times a day (BID) | ORAL | 0 refills | Status: AC

## 2019-04-05 MED ORDER — ASPIRIN 81 MG PO CHEW
324.0000 mg | CHEWABLE_TABLET | Freq: Once | ORAL | Status: AC
  Administered 2019-04-05: 324 mg via ORAL
  Filled 2019-04-05: qty 4

## 2019-04-05 MED ORDER — IOHEXOL 350 MG/ML SOLN
100.0000 mL | Freq: Once | INTRAVENOUS | Status: AC | PRN
  Administered 2019-04-05: 100 mL via INTRAVENOUS

## 2019-04-05 NOTE — ED Triage Notes (Signed)
Pt reports burning CP for the last 30 min. Nonradiating. VSS.

## 2019-04-05 NOTE — ED Provider Notes (Addendum)
[facility] EMERGENCY DEPARTMENT Provider Note   CSN: [id number] Arrival date & time: 04/05/19  1324     History   Chief Complaint Chief Complaint  Patient presents with   Chest Pain    HPI Lisa Galloway is a 58 y.o. female.     HPI  Pt is a 58 y/o female with a h/o allergies, anemia, asthma, GERD, prediabetes, obesity, pleurisy, who presents to the ED today for evaluation of diffuse chest pain that started while she was sitting around 1 PM today.  Describes it as a crushing, pressure, stinging, burning pain.  Initially pain rated 10/10.  It improved to 4/10 without any intervention.  States chest pain radiated to her back.  It is associated with some shortness of breath and a cough.  She denies diaphoresis, nausea, lightheadedness/dizziness.  Denies pain with inspiration.  Denies leg pain/swelling, hemoptysis, recent surgery/trauma, recent long travel, hormone use, personal hx of cancer, or hx of DVT/PE.   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  She gets weekly Covid testing and has been negative.   Had Bo rounds from [place] for breakfast around 8AM.   No smoking hx. No early family hx of heart disease.    She does also adds that she has been having some urinary frequency and odor and is concerned she may have a UTI. She has also had some abnormal discharge. She states she has not been sexually active in over 2 years so has no concern for std.  Past Medical History:  Diagnosis Date   Allergy    HEY FEVER   Anemia    Asthma    GERD (gastroesophageal reflux disease)    Hayfever     Patient Active Problem List   Diagnosis Date Noted   Iron deficiency anemia 10/16/2016   Large hiatal hernia 10/12/2016   Intermittent asthma 10/12/2016   GERD without esophagitis 10/08/2016   Prediabetes 10/08/2016   Obesity (BMI 30-39.9) 10/08/2016    History reviewed. No pertinent surgical history.   OB  History   No obstetric history on file.      Home Medications    Prior to Admission medications   Medication Sig Start Date End Date Taking? Authorizing Provider  albuterol (PROAIR HFA) 108 (90 Base) MCG/ACT inhaler Inhale 2 puffs into the lungs every 6 (six) hours as needed for wheezing or shortness of breath. 10/12/16  Yes Ladell Pier, MD  hydrocortisone 2.5 % cream Apply topically 2 (two) times daily. 10/12/16  Yes Ladell Pier, MD  IRON PO Take 1 tablet by mouth daily.    Yes [provider]  metFORMIN (GLUCOPHAGE) 500 MG tablet Take 1 tablet (500 mg total) by mouth daily with breakfast. 12/06/18  Yes Ladell Pier, MD  Multiple Vitamin (MULTIVITAMIN WITH MINERALS) TABS tablet Take 1 tablet by mouth daily.   Yes [provider]  naproxen (NAPROSYN) 500 MG tablet Take 1 tablet (500 mg total) by mouth 2 (two) times daily as needed. 11/15/18  Yes Rancour, Annie Main, MD  omeprazole (PRILOSEC) 20 MG capsule Take 1 capsule (20 mg total) by mouth 2 (two) times daily before a meal. 12/06/18  Yes Ladell Pier, MD  metroNIDAZOLE (FLAGYL) 500 MG tablet Take 1 tablet (500 mg total) by mouth 2 (two) times daily for 7 days. 04/05/19 04/12/19  Shakyia Bosso S, PA-C    Family History Family History  Problem Relation Age of Onset  Kidney disease Mother    Diabetes Mother    Hypertension Sister    Diabetes Sister    Hypertension Brother    Diabetes Brother    Alcoholism Brother    Hypertension Sister    Diabetes Sister    Colon cancer Maternal Grandmother    Cancer Maternal Grandmother    Colon cancer Maternal Aunt    Cancer Maternal Aunt    Sleep apnea Other     Social History Social History   Tobacco Use   Smoking status: Never Smoker   Smokeless tobacco: Never Used  Substance Use Topics   Alcohol use: No   Drug use: No     Allergies   Ginger, Latex, and Oxycodone   Review of Systems Review of Systems  Constitutional:  Negative for fever.  HENT: Negative for ear pain and sore throat.   Eyes: Negative for visual disturbance.  Respiratory: Positive for cough and shortness of breath.   Cardiovascular: Positive for chest pain. Negative for palpitations and leg swelling.  Gastrointestinal: Negative for abdominal pain, constipation, diarrhea, nausea and vomiting.  Genitourinary: Negative for dysuria and hematuria.  Musculoskeletal: Negative for back pain.  Skin: Negative for rash.  Neurological: Negative for dizziness, weakness and numbness.  All other systems reviewed and are negative.    Physical Exam Updated Vital Signs BP 123/79 (BP Location: Right Arm)    Pulse 60    Temp 97.9 F (36.6 C) (Oral)    Resp 18    Ht 5\' 5"  (1.651 m)    Wt 97.5 kg    SpO2 100%    BMI 35.78 kg/m   Physical Exam Vitals signs and nursing note reviewed.  Constitutional:      General: She is not in acute distress.    Appearance: She is well-developed.  HENT:     Head: Normocephalic and atraumatic.  Eyes:     Conjunctiva/sclera: Conjunctivae normal.  Neck:     Musculoskeletal: Neck supple.  Cardiovascular:     Rate and Rhythm: Normal rate and regular rhythm.     Heart sounds: No murmur.  Pulmonary:     Effort: Pulmonary effort is normal. No respiratory distress.     Breath sounds: Normal breath sounds.  Abdominal:     Palpations: Abdomen is soft.     Tenderness: There is no abdominal tenderness.  Skin:    General: Skin is warm and dry.  Neurological:     Mental Status: She is alert.      ED Treatments / Results  Labs (all labs ordered are listed, but only abnormal results are displayed) Labs Reviewed  WET PREP, GENITAL - Abnormal; Notable for the following components:      Result Value   WBC, Wet Prep HPF POC MANY (*)    All other components within normal limits  BASIC METABOLIC PANEL - Abnormal; Notable for the following components:   Glucose, Bld 113 (*)    All other components within normal limits    CBC - Abnormal; Notable for the following components:   Hemoglobin 11.6 (*)    MCV 79.1 (*)    MCH 24.0 (*)    RDW 18.5 (*)    All other components within normal limits  URINALYSIS, ROUTINE W REFLEX MICROSCOPIC - Abnormal; Notable for the following components:   APPearance HAZY (*)    Leukocytes,Ua TRACE (*)    Bacteria, UA RARE (*)    All other components within normal limits  D-DIMER, QUANTITATIVE (NOT AT [facility]) -  Abnormal; Notable for the following components:   D-Dimer, Quant 0.52 (*)    All other components within normal limits  URINE CULTURE  I-STAT BETA HCG BLOOD, ED ([facility], [facility], [facility] ONLY)  TROPONIN I (HIGH SENSITIVITY)  TROPONIN I (HIGH SENSITIVITY)    EKG EKG Interpretation  Date/Time:  Wednesday April 05 2019 13:35:33 EST Ventricular Rate:  75 PR Interval:  190 QRS Duration: 74 QT Interval:  390 QTC Calculation: 435 R Axis:   -28 Text Interpretation: Normal sinus rhythm Normal ECG No significant change since last tracing Confirmed by Lacretia Leigh ([id number]) on 04/05/2019 5:51:33 PM   Radiology Dg Chest 2 View  Result Date: 04/05/2019 CLINICAL DATA:  Burning chest pain. EXAM: CHEST - 2 VIEW COMPARISON:  Chest x-ray dated August 12, 2018. FINDINGS: The heart is at the upper limits of normal in size. Normal mediastinal contours. Normal pulmonary vascularity. No focal consolidation, pleural effusion, or pneumothorax. No acute osseous abnormality. IMPRESSION: No active cardiopulmonary disease. Electronically Signed   By: Titus Dubin M.D.   On: 04/05/2019 14:21   Ct Angio Chest Pe W And/or Wo Contrast  Result Date: 04/05/2019 CLINICAL DATA:  Pt complains of burning chest pain at home EXAM: CT ANGIOGRAPHY CHEST WITH CONTRAST TECHNIQUE: Multidetector CT imaging of the chest was performed using the standard protocol during bolus administration of intravenous contrast. Multiplanar CT image reconstructions and MIPs were obtained to evaluate the vascular anatomy. CONTRAST:   120mL OMNIPAQUE IOHEXOL 350 MG/ML SOLN COMPARISON:  Chest radiograph 04/05/2019 FINDINGS: Cardiovascular: Satisfactory opacification of the pulmonary arteries to the segmental level. No evidence of pulmonary embolism. Mildly enlarged heart size. No pericardial effusion. Thoracic aorta is normal in size. Normal size of the main pulmonary artery. Mediastinum/Nodes: No enlarged mediastinal, hilar, or axillary lymph nodes. Thyroid gland and trachea demonstrate no significant findings. Moderate size hiatal hernia containing a portion of the stomach. Lungs/Pleura: Lungs are clear. No pleural effusion or pneumothorax. Upper Abdomen: No acute abnormality. Musculoskeletal: No chest wall abnormality. No acute or significant osseous findings. Review of the MIP images confirms the above findings. IMPRESSION: 1. Negative for pulmonary embolus or other acute intrathoracic process. 2. Moderate size hiatal hernia containing a portion of the stomach. 3. Mild cardiomegaly. Electronically Signed   By: Audie Pinto M.D.   On: 04/05/2019 21:28    Procedures Procedures (including critical care time)  Medications Ordered in ED Medications  sodium chloride flush (NS) 0.9 % injection 3 mL (3 mLs Intravenous Not Given 04/05/19 1859)  aspirin chewable tablet 324 mg (324 mg Oral Given 04/05/19 1844)  iohexol (OMNIPAQUE) 350 MG/ML injection 100 mL (100 mLs Intravenous Contrast Given 04/05/19 2106)     Initial Impression / Assessment and Plan / ED Course  I have reviewed the triage vital signs and the nursing notes.  Pertinent labs & imaging results that were available during my care of the patient were reviewed by me and considered in my medical decision making (see chart for details).     Final Clinical Impressions(s) / ED Diagnoses   Final diagnoses:  Atypical chest pain  Vaginal discharge   Pt is a 58 y/o female who presents the ED today for evaluation of chest pain.  Chest pain located bilateral upper chest.   Initially radiated to the back was associated with shortness of breath however improved without intervention.    On arrival, patient with normal vital signs.  She does have reproducible chest wall tenderness.  Clear lungs are clear to auscultation bilaterally.  Heart with regular rate and rhythm.  Abdomen soft and nontender.  Chest pain: With regard to chest pain, reviewed labs, CBC, BMP nonacute. Delta trops negative.  EKG with normal sinus rhythm, no ischemic changes.  Chest x-ray within normal limits. CTA chest neg for PE, w/ hiatal hernia, and mild cardiomegaly.  Patient's heart score is 3 therefore she is low risk for ACS event and her sxs seems less consistent with ACS given I can reproduce her pain and pain is nonexertional. Doubt dissection. feel that she is appropriate for follow-up with her PCP and/or cardiology for further work-up and evaluation of symptoms.  Vaginal discharge: Patient reports vaginal discharge, odor and urinary frequency.  She is not sexually active therefore low risk for STD.  Offered pelvic exam versus self swab versus following up with OB/GYN.  Patient would prefer to do self swab.  Symptoms most consistent with bacterial vaginosis.  Wet prep with many WBCs, no clue cells. With discharge and odor, will still tx for suspected BV and have pt f/u with Ob-GYN later this month as scheduled. UA not convincing for UTI, culture sent.  Pt aware of plan and return precautions. All questions answered, stable for d/c.   ED Discharge Orders         Ordered    metroNIDAZOLE (FLAGYL) 500 MG tablet  2 times daily     04/05/19 2141           Rodney Booze, PA-C 04/05/19 2209    Rodney Booze, PA-C 04/05/19 2214    Lacretia Leigh, MD 04/06/19 1328

## 2019-04-05 NOTE — ED Notes (Signed)
Patient transported to CT 

## 2019-04-05 NOTE — Discharge Instructions (Addendum)
You were given a prescription for antibiotics. Please take the antibiotic prescription fully. Do not drink alcohol while taking this medication.  You will need to follow-up with your OB/GYN about your symptoms.  With regard to your chest pain, you were given information of follow-up with cardiology.  Please call the office to schedule an appointment for follow-up.  Please return to the emergency department for any new or worsening symptoms in the meantime.

## 2019-04-06 LAB — URINE CULTURE

## 2019-04-24 ENCOUNTER — Ambulatory Visit: Attending: Internal Medicine | Admitting: Internal Medicine

## 2019-04-24 ENCOUNTER — Other Ambulatory Visit: Payer: Self-pay

## 2019-05-03 ENCOUNTER — Ambulatory Visit (INDEPENDENT_AMBULATORY_CARE_PROVIDER_SITE_OTHER): Admitting: Nurse Practitioner

## 2019-05-03 ENCOUNTER — Other Ambulatory Visit: Payer: Self-pay

## 2019-05-03 DIAGNOSIS — J4520 Mild intermittent asthma, uncomplicated: Secondary | ICD-10-CM | POA: Diagnosis not present

## 2019-05-03 DIAGNOSIS — J029 Acute pharyngitis, unspecified: Secondary | ICD-10-CM

## 2019-05-03 MED ORDER — CEPACOL SORE THROAT 10-2.1 MG MT LOZG: LOZENGE | OROMUCOSAL | 1 refills | Status: DC

## 2019-05-03 MED ORDER — ALBUTEROL SULFATE HFA 108 (90 BASE) MCG/ACT IN AERS: 2.0000 | INHALATION_SPRAY | Freq: Four times a day (QID) | RESPIRATORY_TRACT | 0 refills | Status: DC | PRN

## 2019-05-03 MED ORDER — AMOXICILLIN 500 MG PO CAPS: 500.0000 mg | ORAL_CAPSULE | Freq: Two times a day (BID) | ORAL | 0 refills | Status: AC

## 2019-05-03 NOTE — Progress Notes (Signed)
Phone visit  Having c/o sore throat, SHOB, fever(highest of 101.7), swollen lymph nodes & headaches. No known sick contact. Has been taking Tylenol.  Needs refill on her Albuterol inhaler.

## 2019-05-03 NOTE — Progress Notes (Signed)
Virtual Visit via Telephone Note Due to national recommendations of social distancing due to [place] 19, telehealth visit is felt to be most appropriate for this patient at this time.  I discussed the limitations, risks, security and privacy concerns of performing an evaluation and management service by telephone and the availability of in person appointments. I also discussed with the patient that there may be a patient responsible charge related to this service. The patient expressed understanding and agreed to proceed.    I connected with Danae Chen on 05/03/19  at  11:10 AM EST  EDT by telephone and verified that I am speaking with the correct person using two identifiers.   Consent I discussed the limitations, risks, security and privacy concerns of performing an evaluation and management service by telephone and the availability of in person appointments. I also discussed with the patient that there may be a patient responsible charge related to this service. The patient expressed understanding and agreed to proceed.   Location of Patient: Private  Residence   Location of Provider: [place] [facility] [place] participating in Telemedicine visit: Geryl Rankins FNP-BC [place]    History of Present Illness: Telemedicine visit for: Pharyngitis  Sore Throat: Patient complains of sore throat. Symptoms began 2 days ago. Pain is of moderate and severe severity. Fever is present, moderate, 101-102+. Other associated symptoms have included sleeplessness.  Fluid intake is modestly decreased.  There has not been contact with an individual with known strep.  Current medications include acetaminophen.  Temp variances 97.8-102.7. Fevers re occurring at night.     Past Medical History:  Diagnosis Date  . Allergy    HEY FEVER  . Anemia   . Asthma   . GERD (gastroesophageal reflux disease)   . Hayfever     History reviewed. No  pertinent surgical history.  Family History  Problem Relation Age of Onset  . Kidney disease Mother   . Diabetes Mother   . Hypertension Sister   . Diabetes Sister   . Hypertension Brother   . Diabetes Brother   . Alcoholism Brother   . Hypertension Sister   . Diabetes Sister   . Colon cancer Maternal Grandmother   . Cancer Maternal Grandmother   . Colon cancer Maternal Aunt   . Cancer Maternal Aunt   . Sleep apnea Other     Social History   Socioeconomic History  . Marital status: Married    Spouse name: Not on file  . Number of children: 2  . Years of education: Not on file  . Highest education level: Not on file  Occupational History  . Occupation: nurse  Social Needs  . Financial resource strain: Not on file  . Food insecurity    Worry: Not on file    Inability: Not on file  . Transportation needs    Medical: Not on file    Non-medical: Not on file  Tobacco Use  . Smoking status: Never Smoker  . Smokeless tobacco: Never Used  Substance and Sexual Activity  . Alcohol use: No  . Drug use: No  . Sexual activity: Yes  Lifestyle  . Physical activity    Days per week: Not on file    Minutes per session: Not on file  . Stress: Not on file  Relationships  . Social Herbalist on phone: Not on file    Gets together: Not on file  Attends religious service: Not on file    Active member of club or organization: Not on file    Attends meetings of clubs or organizations: Not on file    Relationship status: Not on file  Other Topics Concern  . Not on file  Social History Narrative  . Not on file     Observations/Objective: Awake, alert and oriented x 3   Review of Systems  Constitutional: Positive for fever. Negative for chills and malaise/fatigue.  HENT: Positive for sore throat. Negative for congestion, ear discharge, ear pain, hearing loss and sinus pain.   Eyes: Negative.   Respiratory: Negative for cough, sputum production, shortness of  breath and wheezing.   Cardiovascular: Negative.  Negative for chest pain, orthopnea and leg swelling.  Gastrointestinal: Negative.  Negative for abdominal pain, diarrhea, nausea and vomiting.  Neurological: Negative for dizziness, focal weakness and headaches.  Endo/Heme/Allergies: Negative for environmental allergies.  Psychiatric/Behavioral: Negative.     Assessment and Plan: Kalyn was seen today for sore throat.  Diagnoses and all orders for this visit:  Acute pharyngitis, unspecified etiology -     amoxicillin (AMOXIL) 500 MG capsule; Take 1 capsule (500 mg total) by mouth 2 (two) times daily for 10 days. -     Benzocaine-Menthol (CEPACOL SORE THROAT) 10-2.1 MG LOZG; Use as directed. May return to work if afebrile for 24 hours.   Mild intermittent asthma without complication -     albuterol (PROAIR HFA) 108 (90 Base) MCG/ACT inhaler; Inhale 2 puffs into the lungs every 6 (six) hours as needed for wheezing or shortness of breath.     Follow Up Instructions Return if symptoms worsen or fail to improve.     I discussed the assessment and treatment plan with the patient. The patient was provided an opportunity to ask questions and all were answered. The patient agreed with the plan and demonstrated an understanding of the instructions.   The patient was advised to call back or seek an in-person evaluation if the symptoms worsen or if the condition fails to improve as anticipated.  I provided 14 minutes of non-face-to-face time during this encounter including median intraservice time, reviewing previous notes, labs, imaging, medications and explaining diagnosis and management.  Gildardo Pounds, FNP-BC

## 2019-06-05 ENCOUNTER — Ambulatory Visit (HOSPITAL_BASED_OUTPATIENT_CLINIC_OR_DEPARTMENT_OTHER): Admitting: Internal Medicine

## 2019-06-05 ENCOUNTER — Other Ambulatory Visit: Payer: Self-pay

## 2019-06-05 ENCOUNTER — Other Ambulatory Visit (HOSPITAL_COMMUNITY)
Admission: RE | Admit: 2019-06-05 | Discharge: 2019-06-05 | Disposition: A | Discharge status: Home / Self Care | Source: Ambulatory Visit | Attending: Internal Medicine | Admitting: Internal Medicine

## 2019-06-05 ENCOUNTER — Encounter: Payer: Self-pay | Admitting: Internal Medicine

## 2019-06-05 VITALS — BP 145/90 | HR 66 | Resp 16 | Wt 223.0 lb

## 2019-06-05 DIAGNOSIS — R1310 Dysphagia, unspecified: Secondary | ICD-10-CM

## 2019-06-05 DIAGNOSIS — Z1331 Encounter for screening for depression: Secondary | ICD-10-CM | POA: Diagnosis not present

## 2019-06-05 DIAGNOSIS — R0789 Other chest pain: Secondary | ICD-10-CM

## 2019-06-05 DIAGNOSIS — Z23 Encounter for immunization: Secondary | ICD-10-CM | POA: Diagnosis not present

## 2019-06-05 DIAGNOSIS — N898 Other specified noninflammatory disorders of vagina: Secondary | ICD-10-CM

## 2019-06-05 DIAGNOSIS — R7303 Prediabetes: Secondary | ICD-10-CM

## 2019-06-05 DIAGNOSIS — R1319 Other dysphagia: Secondary | ICD-10-CM

## 2019-06-05 DIAGNOSIS — K219 Gastro-esophageal reflux disease without esophagitis: Secondary | ICD-10-CM | POA: Diagnosis not present

## 2019-06-05 DIAGNOSIS — R079 Chest pain, unspecified: Secondary | ICD-10-CM

## 2019-06-05 DIAGNOSIS — R030 Elevated blood-pressure reading, without diagnosis of hypertension: Secondary | ICD-10-CM

## 2019-06-05 DIAGNOSIS — Z1231 Encounter for screening mammogram for malignant neoplasm of breast: Secondary | ICD-10-CM

## 2019-06-05 LAB — GLUCOSE, POCT (MANUAL RESULT ENTRY)
POC Glucose: 64 mg/dl — AB (ref 70–99)
POC Glucose: 74 mg/dl (ref 70–99)

## 2019-06-05 LAB — POCT GLYCOSYLATED HEMOGLOBIN (HGB A1C): HbA1c, POC (prediabetic range): 6.3 % (ref 5.7–6.4)

## 2019-06-05 MED ORDER — OMEPRAZOLE 20 MG PO CPDR: 20.0000 mg | DELAYED_RELEASE_CAPSULE | Freq: Two times a day (BID) | ORAL | 6 refills | Status: DC

## 2019-06-05 NOTE — Progress Notes (Signed)
Patient ID: Lisa BERRINGER, female    DOB: August 21, 1960  MRN: [id number]  CC: Diabetes   Subjective: Lisa Galloway is a 59 y.o. female who presents for chronic ds management. Her concerns today include:  Hx pre-DM, obesity, mild intermittent asthma, GERD, large hiatal hernia.  PreDM/obesity:  BS low here in office today. Did no eat breakfast as yet.  Reports compliance with Metformin.  Tolerating it okay.  Eating habits: she stopped drinking sodas and has cut out using sugar in her coffee and cereal.  Admits she still eat a lot of white carbs.  For meats she eats ham, [place], beef and chicken. -not getting in much exercise.  Has a stationary bike.  Gained 8 lbs since 04/2019  BP elev today.  She does not add table salt to foods Seen in ER 2 mths ago for CP that started while she was watching TV.  EKG revealed no ischemic changes, CXR nl, CTA chest neg for PE with incidental finding of cardiomegaly and hiatal hernia.  -still gets CP intermittently across lower chest.  Sharp in character.  Can occur a rest and when up moving around. Does not radiate.  Has SOB associated with asthma.   No Fhx of heart ds.  Pt does no smoke.    Feels she has a hernia on LT side of her abdomen because she has intermittent pain in this area when she stretches in bed or yawns.  Gets sick on stomach often No abdominal pain with meals but reports food get stuck in chest often causing her to throw up solid foods  Patient Active Problem List   Diagnosis Date Noted  . Iron deficiency anemia 10/16/2016  . Large hiatal hernia 10/12/2016  . Intermittent asthma 10/12/2016  . GERD without esophagitis 10/08/2016  . Prediabetes 10/08/2016  . Obesity (BMI 30-39.9) 10/08/2016     Current Outpatient Medications on File Prior to Visit  Medication Sig Dispense Refill  . albuterol (PROAIR HFA) 108 (90 Base) MCG/ACT inhaler Inhale 2 puffs into the lungs every 6 (six) hours as needed for wheezing or shortness of  breath. 18 g 0  . Benzocaine-Menthol (CEPACOL SORE THROAT) 10-2.1 MG LOZG Use as directed. 18 lozenge 1  . IRON PO Take 1 tablet by mouth daily.     . metFORMIN (GLUCOPHAGE) 500 MG tablet Take 1 tablet (500 mg total) by mouth daily with breakfast. 30 tablet 6  . Multiple Vitamin (MULTIVITAMIN WITH MINERALS) TABS tablet Take 1 tablet by mouth daily.    Marland Kitchen omeprazole (PRILOSEC) 20 MG capsule Take 1 capsule (20 mg total) by mouth 2 (two) times daily before a meal. 30 capsule 6   No current facility-administered medications on file prior to visit.    Allergies  Allergen Reactions  . Ginger Hives    Possible reaction to ginger root  . Latex Itching  . Oxycodone Nausea And Vomiting and Other (See Comments)    Heart racing, flushed, "felt like a heart attack"    Social History   Socioeconomic History  . Marital status: Married    Spouse name: Not on file  . Number of children: 2  . Years of education: Not on file  . Highest education level: Not on file  Occupational History  . Occupation: nurse  Tobacco Use  . Smoking status: Never Smoker  . Smokeless tobacco: Never Used  Substance and Sexual Activity  . Alcohol use: No  . Drug use: No  . Sexual activity: Yes  Other Topics Concern  . Not on file  Social History Narrative  . Not on file   Social Determinants of Health   Financial Resource Strain:   . Difficulty of Paying Living Expenses: Not on file  Food Insecurity:   . Worried About Charity fundraiser in the Last Year: Not on file  . Ran Out of Food in the Last Year: Not on file  Transportation Needs:   . Lack of Transportation (Medical): Not on file  . Lack of Transportation (Non-Medical): Not on file  Physical Activity:   . Days of Exercise per Week: Not on file  . Minutes of Exercise per Session: Not on file  Stress:   . Feeling of Stress : Not on file  Social Connections:   . Frequency of Communication with Friends and Family: Not on file  . Frequency of Social  Gatherings with Friends and Family: Not on file  . Attends Religious Services: Not on file  . Active Member of Clubs or Organizations: Not on file  . Attends Archivist Meetings: Not on file  . Marital Status: Not on file  Intimate Partner Violence:   . Fear of Current or Ex-Partner: Not on file  . Emotionally Abused: Not on file  . Physically Abused: Not on file  . Sexually Abused: Not on file    Family History  Problem Relation Age of Onset  . Kidney disease Mother   . Diabetes Mother   . Hypertension Sister   . Diabetes Sister   . Hypertension Brother   . Diabetes Brother   . Alcoholism Brother   . Hypertension Sister   . Diabetes Sister   . Colon cancer Maternal Grandmother   . Cancer Maternal Grandmother   . Colon cancer Maternal Aunt   . Cancer Maternal Aunt   . Sleep apnea Other     No past surgical history on file.  ROS: Review of Systems GYN: c/o vaginal odor and scant vaginal dischg.  She has not been sexually active in a while.  Reported same on ER visit 2 mths ago. Wet prep was neg for clue cells but pt treated anyway with Flagyl.  She completed the abx but still with odor.  Would like to be rechecked  PHYSICAL EXAM: BP (!) 134/93   Pulse 66   Resp 16   Wt 223 lb (101.2 kg)   SpO2 100%   BMI 37.11 kg/m   Wt Readings from Last 3 Encounters:  06/05/19 223 lb (101.2 kg)  04/05/19 215 lb (97.5 kg)  12/06/18 219 lb (99.3 kg)     Physical Exam  General appearance - alert, well appearing, and in no distress Mental status - normal mood, behavior, speech, dress, motor activity, and thought processes Mouth - mucous membranes moist, pharynx normal without lesions Neck - supple, no significant adenopathy.  No thyroid enlargement Chest - clear to auscultation, no wheezes, rales or rhonchi, symmetric air entry Heart - normal rate, regular rhythm, normal S1, S2, no murmurs, rubs, clicks or gallops Abdomen -normal bowel sounds, non-distended. Soft,  non-tender, no organomegaly.  No hernia appreciated on LT side of abdomen or in midline Extremities - no LE edema  Depression screen [facility] 2/9 06/05/2019 12/06/2018 11/13/2016  Decreased Interest 1 0 0  Down, Depressed, Hopeless 1 0 0  PHQ - 2 Score 2 0 0  Altered sleeping 2 1 -  Tired, decreased energy 3 2 -  Change in appetite 2 0 -  Feeling  bad or failure about yourself  1 0 -  Trouble concentrating 0 0 -  Moving slowly or fidgety/restless 0 0 -  Suicidal thoughts 0 0 -  PHQ-9 Score 10 3 -     CMP Latest Ref Rng & Units 04/05/2019 08/12/2018 10/12/2016  Glucose 70 - 99 mg/dL 113(H) 124(H) 146(H)  BUN 6 - 20 mg/dL 11 15 12   Creatinine 0.44 - 1.00 mg/dL 0.92 0.79 0.87  Sodium 135 - 145 mmol/L 140 137 142  Potassium 3.5 - 5.1 mmol/L 4.3 3.9 4.2  Chloride 98 - 111 mmol/L 105 105 105  CO2 22 - 32 mmol/L 24 24 22   Calcium 8.9 - 10.3 mg/dL 9.1 9.1 9.3  Total Protein 6.0 - 8.5 g/dL - - 7.1  Total Bilirubin 0.0 - 1.2 mg/dL - - 0.3  Alkaline Phos 39 - 117 IU/L - - 125(H)  AST 0 - 40 IU/L - - 23  ALT 0 - 32 IU/L - - 27   Lipid Panel     Component Value Date/Time   CHOL 142 10/12/2016 1544   TRIG 94 10/12/2016 1544   HDL 71 10/12/2016 1544   CHOLHDL 2.0 10/12/2016 1544   LDLCALC 52 10/12/2016 1544    CBC    Component Value Date/Time   WBC 8.3 04/05/2019 1337   RBC 4.83 04/05/2019 1337   HGB 11.6 (L) 04/05/2019 1337   HGB 11.1 10/12/2016 1544   HCT 38.2 04/05/2019 1337   HCT 37.4 10/12/2016 1544   PLT 370 04/05/2019 1337   PLT 382 (H) 10/12/2016 1544   MCV 79.1 (L) 04/05/2019 1337   MCV 79 10/12/2016 1544   MCH 24.0 (L) 04/05/2019 1337   MCHC 30.4 04/05/2019 1337   RDW 18.5 (H) 04/05/2019 1337   RDW 18.5 (H) 10/12/2016 1544    ASSESSMENT AND PLAN: 1. Prediabetes Pt was given some crackers here in office to bring BS up Commended her on dietary changes so far.  Encouraged to cut portion sizes of white carbs Encourage regular exercise once CP has been eval by cardiology -  Glucose (CBG) - HgB A1c - Microalbumin/Creatinine Ratio, Urine - Lipid panel - Hepatic Function Panel - Glucose (CBG)  2. Esophageal dysphagia - Ambulatory referral to Gastroenterology  3. Gastroesophageal reflux disease without esophagitis Counseling given.  Inc Prilosec to BID dosing - omeprazole (PRILOSEC) 20 MG capsule; Take 1 capsule (20 mg total) by mouth 2 (two) times daily before a meal.  Dispense: 60 capsule; Refill: 6  4. Chest pain in adult Some what atypical but will refer to cardiology for further eval - Ambulatory referral to Cardiology  5. Elevated blood pressure reading Prior readings were good DASH diet discussed and encouraged. appt with clinical pharmacist in 3 wks for repeat.  If still elev, would start low dose Norvasc or HCTZ.  6. Vaginal discharge - Cervicovaginal ancillary only  7. Positive depression screening Noted today.  Will have to discuss on next visit as we did not get to this today  8. Encounter for screening mammogram for malignant neoplasm of breast - MM Digital Screening; Future       Patient was given the opportunity to ask questions.  Patient verbalized understanding of the plan and was able to repeat key elements of the plan.   Orders Placed This Encounter  Procedures  . Flu Vaccine QUAD 6+ mos PF IM (Fluarix Quad PF)  . Tdap vaccine greater than or equal to 7yo IM  . Microalbumin/Creatinine Ratio, Urine  .  Glucose (CBG)  . HgB A1c     Requested Prescriptions    No prescriptions requested or ordered in this encounter    No follow-ups on file.  Karle Plumber, MD, FACP

## 2019-06-05 NOTE — Patient Instructions (Addendum)
Please give patient an appointment with the clinical pharmacist for blood pressure recheck in 3 weeks.  Increase omeprazole to 20 mg twice a day.  Follow a Healthy Eating Plan - You can do it! Limit sugary drinks.  Avoid sodas, sweet tea, sport or energy drinks, or fruit drinks.  Drink water, lo-fat milk, or diet drinks. Limit snack foods.   Cut back on candy, cake, cookies, chips, ice cream.  These are a special treat, only in small amounts. Eat plenty of vegetables.  Especially dark green, red, and orange vegetables. Aim for at least 3 servings a day. More is better! Include fruit in your daily diet.  Whole fruit is much healthier than fruit juice! Limit "white" bread, "white" pasta, "white" rice.   Choose "100% whole grain" products, brown or wild rice. Avoid fatty meats. Try "Meatless Monday" and choose eggs or beans one day a week.  When eating meat, choose lean meats like chicken, [place], and fish.  Grill, broil, or bake meats instead of frying, and eat poultry without the skin. Eat less salt.  Avoid frozen pizzas, frozen dinners and salty foods.  Use seasonings other than salt in cooking.  This can help blood pressure and keep you from swelling Beer, wine and liquor have calories.  If you can safely drink alcohol, limit to 1 drink per day for women, 2 drinks for men   Td (Tetanus, Diphtheria) Vaccine: What You Need to Know 1. Why get vaccinated? Td vaccine can prevent tetanus and diphtheria. Tetanus enters the body through cuts or wounds. Diphtheria spreads from person to person.  TETANUS (T) causes painful stiffening of the muscles. Tetanus can lead to serious health problems, including being unable to open the mouth, having trouble swallowing and breathing, or death.  DIPHTHERIA (D) can lead to difficulty breathing, heart failure, paralysis, or death. 2. Td vaccine Td is only for children 7 years and older, adolescents, and adults.  Td is usually given as a booster dose every 10  years, but it can also be given earlier after a severe and dirty wound or burn. Another vaccine, called Tdap, that protects against pertussis, also known as "whooping cough," in addition to tetanus and diphtheria, may be used instead of Td.  Td may be given at the same time as other vaccines. 3. Talk with your health care provider Tell your vaccine provider if the person getting the vaccine:  Has had an allergic reaction after a previous dose of any vaccine that protects against tetanus or diphtheria, or has any severe, life-threatening allergies.  Has ever had Guillain-Barr Syndrome (also called GBS).  Has had severe pain or swelling after a previous dose of any vaccine that protects against tetanus or diphtheria. In some cases, your health care provider may decide to postpone Td vaccination to a future visit.  People with minor illnesses, such as a cold, may be vaccinated. People who are moderately or severely ill should usually wait until they recover before getting Td vaccine.  Your health care provider can give you more information. 4. Risks of a vaccine reaction  Pain, redness, or swelling where the shot was given, mild fever, headache, feeling tired, and nausea, vomiting, diarrhea, or stomachache sometimes happen after Td vaccine. People sometimes faint after medical procedures, including vaccination. Tell your provider if you feel dizzy or have vision changes or ringing in the ears.  As with any medicine, there is a very remote chance of a vaccine causing a severe allergic reaction, other serious  injury, or death. 5. What if there is a serious problem? An allergic reaction could occur after the vaccinated person leaves the clinic. If you see signs of a severe allergic reaction (hives, swelling of the face and throat, difficulty breathing, a fast heartbeat, dizziness, or weakness), call 9-1-1 and get the person to the nearest hospital.  For other signs that concern you, call your health  care provider.  Adverse reactions should be reported to the Vaccine Adverse Event Reporting System (VAERS). Your health care provider will usually file this report, or you can do it yourself. Visit the VAERS website at www.vaers.SamedayNews.es or call 2030140646. VAERS is only for reporting reactions, and VAERS staff do not give medical advice. 6. The National Vaccine Injury Compensation Program The [employer] Vaccine Injury Compensation Program (VICP) is a federal program that was created to compensate people who may have been injured by certain vaccines. Visit the VICP website at GoldCloset.com.ee or call 406-705-7128 to learn about the program and about filing a claim. There is a time limit to file a claim for compensation. 7. How can I learn more?  Ask your health care provider.  Call your local or state health department.  Contact the Centers for Disease Control and Prevention (CDC): ? Call (415)887-8294 (1-800-CDC-INFO) or ? Visit CDC's website at http://hunter.com/ Vaccine Information Statement Td Vaccine (08/31/18) This information is not intended to replace advice given to you by your health care provider. Make sure you discuss any questions you have with your health care provider. Document Revised: 10/10/2018 Document Reviewed: 09/12/2018 Elsevier Patient Education  [place].   Influenza Virus Vaccine injection (Fluarix) What is this medicine? INFLUENZA VIRUS VACCINE (in floo EN zuh VAHY ruhs vak SEEN) helps to reduce the risk of getting influenza also known as the flu. This medicine may be used for other purposes; ask your health care provider or pharmacist if you have questions. COMMON BRAND NAME(S): Fluarix, Fluzone What should I tell my health care provider before I take this medicine? They need to know if you have any of these conditions:  bleeding disorder like hemophilia  fever or infection  Guillain-Barre syndrome or other neurological  problems  immune system problems  infection with the human immunodeficiency virus (HIV) or AIDS  low blood platelet counts  multiple sclerosis  an unusual or allergic reaction to influenza virus vaccine, eggs, chicken proteins, latex, gentamicin, other medicines, foods, dyes or preservatives  pregnant or trying to get pregnant  breast-feeding How should I use this medicine? This vaccine is for injection into a muscle. It is given by a health care professional. A copy of Vaccine Information Statements will be given before each vaccination. Read this sheet carefully each time. The sheet may change frequently. Talk to your pediatrician regarding the use of this medicine in children. Special care may be needed. Overdosage: If you think you have taken too much of this medicine contact a poison control center or emergency room at once. NOTE: This medicine is only for you. Do not share this medicine with others. What if I miss a dose? This does not apply. What may interact with this medicine?  chemotherapy or radiation therapy  medicines that lower your immune system like etanercept, anakinra, infliximab, and adalimumab  medicines that treat or prevent blood clots like warfarin  phenytoin  steroid medicines like prednisone or cortisone  theophylline  vaccines This list may not describe all possible interactions. Give your health care provider a list of all the medicines, herbs,  non-prescription drugs, or dietary supplements you use. Also tell them if you smoke, drink alcohol, or use illegal drugs. Some items may interact with your medicine. What should I watch for while using this medicine? Report any side effects that do not go away within 3 days to your doctor or health care professional. Call your health care provider if any unusual symptoms occur within 6 weeks of receiving this vaccine. You may still catch the flu, but the illness is not usually as bad. You cannot get the flu  from the vaccine. The vaccine will not protect against colds or other illnesses that may cause fever. The vaccine is needed every year. What side effects may I notice from receiving this medicine? Side effects that you should report to your doctor or health care professional as soon as possible:  allergic reactions like skin rash, itching or hives, swelling of the face, lips, or tongue Side effects that usually do not require medical attention (report to your doctor or health care professional if they continue or are bothersome):  fever  headache  muscle aches and pains  pain, tenderness, redness, or swelling at site where injected  weak or tired This list may not describe all possible side effects. Call your doctor for medical advice about side effects. You may report side effects to FDA at 1-800-FDA-1088. Where should I keep my medicine? This vaccine is only given in a clinic, pharmacy, doctor's office, or other health care setting and will not be stored at home. NOTE: This sheet is a summary. It may not cover all possible information. If you have questions about this medicine, talk to your doctor, pharmacist, or health care provider.  2020 Elsevier/Gold Standard (2007-12-14 09:30:40)

## 2019-06-06 LAB — CERVICOVAGINAL ANCILLARY ONLY
Bacterial Vaginitis (gardnerella): NEGATIVE
Candida Glabrata: NEGATIVE
Candida Vaginitis: NEGATIVE
Chlamydia: NEGATIVE
Comment: NEGATIVE
Comment: NEGATIVE
Comment: NEGATIVE
Comment: NEGATIVE
Comment: NEGATIVE
Comment: NORMAL
Neisseria Gonorrhea: NEGATIVE
Trichomonas: NEGATIVE

## 2019-06-06 LAB — HEPATIC FUNCTION PANEL
ALT: 32 IU/L (ref 0–32)
AST: 29 IU/L (ref 0–40)
Albumin: 4.2 g/dL (ref 3.8–4.9)
Alkaline Phosphatase: 126 IU/L — ABNORMAL HIGH (ref 39–117)
Bilirubin Total: <0.2 mg/dL (ref 0.0–1.2)
Bilirubin, Direct: 0.08 mg/dL (ref 0.00–0.40)
Total Protein: 7.1 g/dL (ref 6.0–8.5)

## 2019-06-06 LAB — LIPID PANEL
Chol/HDL Ratio: 1.9 ratio (ref 0.0–4.4)
Cholesterol, Total: 157 mg/dL (ref 100–199)
HDL: 83 mg/dL (ref >39)
LDL Chol Calc (NIH): 59 mg/dL (ref 0–99)
Triglycerides: 77 mg/dL (ref 0–149)
VLDL Cholesterol Cal: 15 mg/dL (ref 5–40)

## 2019-06-06 LAB — MICROALBUMIN / CREATININE URINE RATIO
Creatinine, Urine: 258 mg/dL
Microalb/Creat Ratio: 6 mg/g creat (ref 0–29)
Microalbumin, Urine: 14.6 ug/mL

## 2019-06-09 ENCOUNTER — Ambulatory Visit (INDEPENDENT_AMBULATORY_CARE_PROVIDER_SITE_OTHER): Admitting: Cardiovascular Disease

## 2019-06-09 ENCOUNTER — Other Ambulatory Visit: Payer: Self-pay

## 2019-06-09 ENCOUNTER — Encounter: Payer: Self-pay | Admitting: Cardiovascular Disease

## 2019-06-09 ENCOUNTER — Telehealth: Payer: Self-pay

## 2019-06-09 DIAGNOSIS — R0789 Other chest pain: Secondary | ICD-10-CM | POA: Diagnosis not present

## 2019-06-09 DIAGNOSIS — R0602 Shortness of breath: Secondary | ICD-10-CM

## 2019-06-09 DIAGNOSIS — R072 Precordial pain: Secondary | ICD-10-CM

## 2019-06-09 MED ORDER — METOPROLOL TARTRATE 100 MG PO TABS: ORAL_TABLET | ORAL | 0 refills | Status: DC

## 2019-06-09 NOTE — Telephone Encounter (Signed)
Tried contacting pt to go over lab results the number provided for pt is the wrong number and is unable to get in touch with pt

## 2019-06-09 NOTE — Patient Instructions (Addendum)
Medication Instructions:  Take 100mg  Metoprolol 2 hours prior to CTA.  If you need a refill on your cardiac medications before your next appointment, please call your pharmacy.   Lab work: BMET If you have labs (blood work) drawn today and your tests are completely normal, you will receive your results only by: Colon (if you have MyChart) OR A paper copy in the mail If you have any lab test that is abnormal or we need to change your treatment, we will call you to review the results.  Testing/Procedures:  Your physician has requested that you have an echocardiogram. Echocardiography is a painless test that uses sound waves to create images of your heart. It provides your doctor with information about the size and shape of your heart and how well your heart's chambers and valves are working. This procedure takes approximately one hour. There are no restrictions for this procedure. [facility] [place],  [id number] [phone]  Please arrive at the [facility] main entrance of [facility] 30-45 minutes prior to test start time. Proceed to the [facility] Radiology Department (first floor) to check-in and test prep.  Please follow these instructions carefully (unless otherwise directed):  On the Night Before the Test: . Be sure to Drink plenty of water. . Do not consume any caffeinated/decaffeinated beverages or chocolate 12 hours prior to your test. . Do not take any antihistamines 12 hours prior to your test.  On the Day of the Test: . Drink plenty of water. Do not drink any water within one hour of the test. . Do not eat any food 4 hours prior to the test. . You may take your regular medications prior to the test.  . Take metoprolol (Lopressor) two hours prior to test. . HOLD Furosemide/Hydrochlorothiazide morning of the test. . FEMALES- please wear underwire-free bra if available       After the Test: . Drink plenty of water. . After receiving IV contrast, you may experience a mild flushed feeling. This is normal. . On occasion, you may experience a mild rash up to 24 hours after the test. This is not dangerous. If this occurs, you can take Benadryl 25 mg and increase your fluid intake. . If you experience trouble breathing, this can be serious. If it is severe call 911 IMMEDIATELY. If it is mild, please call our office. . If you take any of these medications: Glipizide/Metformin, Avandament, Glucavance, please do not take 48 hours after completing test unless otherwise instructed.   Once we have confirmed authorization from your insurance company, we will call you to set up a date and time for your test.   For non-scheduling related questions, please contact the cardiac imaging nurse navigator should you have any questions/concerns: Marchia Bond, RN Navigator Cardiac Imaging [facility] [phone] Office    Follow-Up: At [facility], you and your health needs are our priority.  As part of our continuing mission to provide you with exceptional heart care, we have created designated Provider Care Teams.  These Care Teams include your primary Cardiologist (physician) and Advanced Practice Providers (APPs -  Physician Assistants and Nurse Practitioners) who all work together to provide you with the care you need, when you need it. You may see Dr Gwenlyn Found or one of the following Advanced Practice Providers on your designated Care Team:    Kerin Ransom, [place]  Sande Rives, PA-C  Coletta Memos, FNP  Your physician wants you to follow-up as needed.

## 2019-06-09 NOTE — Progress Notes (Signed)
06/09/2019 Lisa Galloway   1961-01-22  CB:[id number]  Primary Physician Lisa Pier, MD Primary Cardiologist: Lisa Harp MD Lisa Galloway, [place]  HPI:  Lisa Galloway is a 59 y.o. moderately overweight married African-American female mother of 3 daughters, grandmother 1 grandchild referred by Lisa Galloway, her PCP for cardiac evaluation because of chest pain.  She basically has no cardiac risk factors other than mild type 2 diabetes.  She is an LPN.  She is never had a heart attack or stroke.  She was seen in the emergency room 04/05/2019 with atypical chest pain.  Work-up was unrevealing including a negative chest CT and normal enzymes.  She does have a history of GERD, large hiatal hernia and reactive airways disease.  She has had atypical chest pain since on a daily basis sometimes lasting for hours at a time with radiation down her right upper extremity.   Current Meds  Medication Sig  . albuterol (PROAIR HFA) 108 (90 Base) MCG/ACT inhaler Inhale 2 puffs into the lungs every 6 (six) hours as needed for wheezing or shortness of breath.  . Benzocaine-Menthol (CEPACOL SORE THROAT) 10-2.1 MG LOZG Use as directed.  . IRON PO Take 1 tablet by mouth daily.   . metFORMIN (GLUCOPHAGE) 500 MG tablet Take 1 tablet (500 mg total) by mouth daily with breakfast.  . Multiple Vitamin (MULTIVITAMIN WITH MINERALS) TABS tablet Take 1 tablet by mouth daily.  Marland Kitchen omeprazole (PRILOSEC) 20 MG capsule Take 1 capsule (20 mg total) by mouth 2 (two) times daily before a meal.     Allergies  Allergen Reactions  . Ginger Hives    Possible reaction to ginger root  . Latex Itching  . Oxycodone Nausea And Vomiting and Other (See Comments)    Heart racing, flushed, "felt like a heart attack"    Social History   Socioeconomic History  . Marital status: Married    Spouse name: Not on file  . Number of children: 2  . Years of education: Not on file  . Highest education level: Not on  file  Occupational History  . Occupation: nurse  Tobacco Use  . Smoking status: Never Smoker  . Smokeless tobacco: Never Used  Substance and Sexual Activity  . Alcohol use: No  . Drug use: No  . Sexual activity: Yes  Other Topics Concern  . Not on file  Social History Narrative  . Not on file   Social Determinants of Health   Financial Resource Strain:   . Difficulty of Paying Living Expenses: Not on file  Food Insecurity:   . Worried About Charity fundraiser in the Last Year: Not on file  . Ran Out of Food in the Last Year: Not on file  Transportation Needs:   . Lack of Transportation (Medical): Not on file  . Lack of Transportation (Non-Medical): Not on file  Physical Activity:   . Days of Exercise per Week: Not on file  . Minutes of Exercise per Session: Not on file  Stress:   . Feeling of Stress : Not on file  Social Connections:   . Frequency of Communication with Friends and Family: Not on file  . Frequency of Social Gatherings with Friends and Family: Not on file  . Attends Religious Services: Not on file  . Active Member of Clubs or Organizations: Not on file  . Attends Archivist Meetings: Not on file  . Marital Status: Not on file  Intimate Partner Violence:   . Fear of Current or Ex-Partner: Not on file  . Emotionally Abused: Not on file  . Physically Abused: Not on file  . Sexually Abused: Not on file     Review of Systems: General: negative for chills, fever, night sweats or weight changes.  Cardiovascular: negative for chest pain, dyspnea on exertion, edema, orthopnea, palpitations, paroxysmal nocturnal dyspnea or shortness of breath Dermatological: negative for rash Respiratory: negative for cough or wheezing Urologic: negative for hematuria Abdominal: negative for nausea, vomiting, diarrhea, bright red blood per rectum, melena, or hematemesis Neurologic: negative for visual changes, syncope, or dizziness All other systems reviewed and are  otherwise negative except as noted above.    Blood pressure 125/84, pulse 74, temperature (!) 97.2 F (36.2 C), height 5\' 5"  (1.651 m), weight 219 lb (99.3 kg), SpO2 100 %.  General appearance: alert and no distress Neck: no adenopathy, no carotid bruit, no JVD, supple, symmetrical, trachea midline and thyroid not enlarged, symmetric, no tenderness/mass/nodules Lungs: clear to auscultation bilaterally Heart: regular rate and rhythm, S1, S2 normal, no murmur, click, rub or gallop Extremities: extremities normal, atraumatic, no cyanosis or edema Pulses: 2+ and symmetric Skin: Skin color, texture, turgor normal. No rashes or lesions Neurologic: Alert and oriented X 3, normal strength and tone. Normal symmetric reflexes. Normal coordination and gait  EKG not performed today  ASSESSMENT AND PLAN:   Atypical chest pain Ms. Stoos was seen in the ER on 04/05/2019 with atypical chest pain.  She ruled out for myocardial infarction.  Her enzymes were negative and her EKG showed no acute changes.  Chest CT was negative for pulmonary embolism.  There were no coronary calcifications noted.  Since that time she has had daily chest pain with some radiation down her right arm.  Her only risk factor is mild type 2 diabetes.  She is never had a heart attack or stroke.  There is no family history for heart disease.  She is not a smoker.  I am going to get a coronary CTA to further evaluate.      Lisa Harp MD FACP,FACC,FAHA, [facility] 06/09/2019 3:50 PM

## 2019-06-09 NOTE — Assessment & Plan Note (Signed)
Ms. Raffensperger was seen in the ER on 04/05/2019 with atypical chest pain.  She ruled out for myocardial infarction.  Her enzymes were negative and her EKG showed no acute changes.  Chest CT was negative for pulmonary embolism.  There were no coronary calcifications noted.  Since that time she has had daily chest pain with some radiation down her right arm.  Her only risk factor is mild type 2 diabetes.  She is never had a heart attack or stroke.  There is no family history for heart disease.  She is not a smoker.  I am going to get a coronary CTA to further evaluate.

## 2019-06-10 LAB — BASIC METABOLIC PANEL
BUN/Creatinine Ratio: 16 (ref 9–23)
BUN: 13 mg/dL (ref 6–24)
CO2: 24 mmol/L (ref 20–29)
Calcium: 9.4 mg/dL (ref 8.7–10.2)
Chloride: 107 mmol/L — ABNORMAL HIGH (ref 96–106)
Creatinine, Ser: 0.8 mg/dL (ref 0.57–1.00)
GFR calc Af Amer: 95 mL/min/{1.73_m2} (ref >59)
GFR calc non Af Amer: 82 mL/min/{1.73_m2} (ref >59)
Glucose: 93 mg/dL (ref 65–99)
Potassium: 4.4 mmol/L (ref 3.5–5.2)
Sodium: 142 mmol/L (ref 134–144)

## 2019-06-21 ENCOUNTER — Other Ambulatory Visit: Payer: Self-pay

## 2019-06-21 ENCOUNTER — Ambulatory Visit (HOSPITAL_COMMUNITY): Attending: Cardiovascular Disease

## 2019-06-21 DIAGNOSIS — R0789 Other chest pain: Secondary | ICD-10-CM | POA: Diagnosis present

## 2019-06-26 ENCOUNTER — Other Ambulatory Visit: Payer: Self-pay

## 2019-06-26 ENCOUNTER — Ambulatory Visit: Attending: Internal Medicine | Admitting: Pharmacist

## 2019-06-26 VITALS — BP 112/77 | HR 82

## 2019-06-26 DIAGNOSIS — R030 Elevated blood-pressure reading, without diagnosis of hypertension: Secondary | ICD-10-CM

## 2019-06-26 NOTE — Progress Notes (Signed)
   S:   PCP: Dr. Wynetta Emery  Patient arrives in good spirits. Presents to the clinic for hypertension evaluation, counseling, and management.  Patient was referred and last seen by Primary Care Provider on 06/05/2019.  Patient does not take medications for high blood pressure.  Dietary habits include: limit salt; admits to drinking coffee throughout the day Exercise habits include: spends a couple of sessions on her exercise bike at home a week  Family / Social history:  - FHx: DM, HTN, kidney disease - Tobacco: never smoker - Alcohol use: denies   O:  Vitals:   06/26/19 1440  BP: 112/77  Pulse: 82    Home BP readings: reports it being "low" but does not provide me with any readings   Last 3 Office BP readings: BP Readings from Last 3 Encounters:  06/26/19 112/77  06/09/19 125/84  06/05/19 (!) 145/90    BMET    Component Value Date/Time   NA 142 06/09/2019 1605   K 4.4 06/09/2019 1605   CL 107 (H) 06/09/2019 1605   CO2 24 06/09/2019 1605   GLUCOSE 93 06/09/2019 1605   GLUCOSE 113 (H) 04/05/2019 1337   BUN 13 06/09/2019 1605   CREATININE 0.80 06/09/2019 1605   CALCIUM 9.4 06/09/2019 1605   GFRNONAA 82 06/09/2019 1605   GFRAA 95 06/09/2019 1605   Renal function: CrCl cannot be calculated (Unknown ideal weight.).  Clinical ASCVD: No  The 10-year ASCVD risk score Mikey Bussing DC Jr., et al., 2013) is: 1.5%   Values used to calculate the score:     Age: 59 years     Sex: Female     Is Non-Hispanic African American: Yes     Diabetic: No     Tobacco smoker: No     Systolic Blood Pressure: [id number] mmHg     Is BP treated: No     HDL Cholesterol: 83 mg/dL     Total Cholesterol: 157 mg/dL  A/P: Pt's BP is normal today. Currently not on current medications. She had a clinic visit with Cardiology on 06/09/2019 and her BP was good there. Will hold off on recommending any BP medication at this time.   -Counseled on lifestyle modifications for blood pressure control including reduced  dietary sodium, increased exercise, adequate sleep  Results reviewed and written information provided.   Total time in face-to-face counseling 15 minutes.   F/U Clinic Visit with PCP.   Benard Halsted, PharmD, [place] [phone]

## 2019-06-27 ENCOUNTER — Telehealth (HOSPITAL_COMMUNITY): Payer: Self-pay | Admitting: Emergency Medicine

## 2019-06-27 NOTE — Telephone Encounter (Signed)
Left message on voicemail with name and callback number Shaneequa Bahner RN Navigator Cardiac Imaging [facility] [phone] Office [phone] Cell  

## 2019-06-28 ENCOUNTER — Other Ambulatory Visit: Payer: Self-pay

## 2019-06-28 ENCOUNTER — Ambulatory Visit (HOSPITAL_COMMUNITY)
Admission: RE | Admit: 2019-06-28 | Discharge: 2019-06-28 | Disposition: A | Discharge status: Home / Self Care | Source: Ambulatory Visit | Attending: Cardiovascular Disease | Admitting: Cardiovascular Disease

## 2019-06-28 DIAGNOSIS — R0602 Shortness of breath: Secondary | ICD-10-CM

## 2019-06-28 DIAGNOSIS — R072 Precordial pain: Secondary | ICD-10-CM

## 2019-06-28 MED ORDER — IOHEXOL 350 MG/ML SOLN
80.0000 mL | Freq: Once | INTRAVENOUS | Status: AC | PRN
  Administered 2019-06-28: 80 mL via INTRAVENOUS

## 2019-06-28 MED ORDER — NITROGLYCERIN 0.4 MG SL SUBL
0.8000 mg | SUBLINGUAL_TABLET | Freq: Once | SUBLINGUAL | Status: AC
  Administered 2019-06-28: 0.8 mg via SUBLINGUAL

## 2019-06-28 MED ORDER — NITROGLYCERIN 0.4 MG SL SUBL
SUBLINGUAL_TABLET | SUBLINGUAL | Status: AC
  Filled 2019-06-28: qty 2

## 2019-06-30 ENCOUNTER — Telehealth: Payer: Self-pay

## 2019-06-30 NOTE — Telephone Encounter (Signed)
Advised pt of labs. Verbalized understanding. Advised pt to see PCP regarding hernia.

## 2019-07-07 ENCOUNTER — Encounter: Payer: Self-pay | Admitting: Physician Assistant

## 2019-07-12 ENCOUNTER — Telehealth: Payer: Self-pay | Admitting: Internal Medicine

## 2019-07-12 NOTE — Telephone Encounter (Signed)
PC returned to pt.  I left VMM letting her know that I received her message and that I reviewed the cardiologist note and heart study.  He is not concern for any significant heart disease.  Note that BP was okay on visit with clinical pharmacist and cardiologist.  ? Of whether anxiety playing a role.  Advise to be seen in ER if symptoms not better otherwise call office tomorrow for appt.

## 2019-07-12 NOTE — Telephone Encounter (Signed)
Will forward to pcp

## 2019-07-12 NOTE — Telephone Encounter (Signed)
Patient called to speak with pcp regarding her bp readings. Patient stated her bp today has been 161/96 pulse 68  and the second on was taken at @2 :18pm at 142/102 and pulse 65. Patient stated that her pressure in her chest is getting heavier and worse. Patient also stated that she is frequent headaches.  Patient complained of pain currently at a 7/8 out of 10. Please fu at your earliest convenience.

## 2019-07-13 ENCOUNTER — Encounter: Payer: Self-pay | Admitting: Physician Assistant

## 2019-07-13 ENCOUNTER — Ambulatory Visit (INDEPENDENT_AMBULATORY_CARE_PROVIDER_SITE_OTHER): Admitting: Physician Assistant

## 2019-07-13 ENCOUNTER — Other Ambulatory Visit: Payer: Self-pay

## 2019-07-13 VITALS — BP 130/80 | HR 80 | Temp 97.4°F | Ht 65.0 in | Wt 222.8 lb

## 2019-07-13 DIAGNOSIS — K219 Gastro-esophageal reflux disease without esophagitis: Secondary | ICD-10-CM | POA: Diagnosis not present

## 2019-07-13 DIAGNOSIS — R1084 Generalized abdominal pain: Secondary | ICD-10-CM

## 2019-07-13 DIAGNOSIS — R0789 Other chest pain: Secondary | ICD-10-CM

## 2019-07-13 DIAGNOSIS — K449 Diaphragmatic hernia without obstruction or gangrene: Secondary | ICD-10-CM

## 2019-07-13 MED ORDER — OMEPRAZOLE 40 MG PO CPDR: 40.0000 mg | DELAYED_RELEASE_CAPSULE | Freq: Two times a day (BID) | ORAL | 5 refills | Status: DC

## 2019-07-13 MED ORDER — FAMOTIDINE 40 MG PO TABS: 40.0000 mg | ORAL_TABLET | Freq: Two times a day (BID) | ORAL | 5 refills | Status: DC

## 2019-07-13 NOTE — Patient Instructions (Addendum)
If you are age 58 or older, your body mass index should be between 23-30. Your Body mass index is 37.08 kg/m. If this is out of the aforementioned range listed, please consider follow up with your Primary Care Provider.  If you are age 75 or younger, your body mass index should be between 19-25. Your Body mass index is 37.08 kg/m. If this is out of the aformentioned range listed, please consider follow up with your Primary Care Provider.   Start omeprazole 40 mg twice a day and Pepcid 40 mg twice a day  We will send your records [facility]. Please arrive 15 minutes early for registration. Make certain to bring a list of current medications, including any over the counter medications or vitamins. Also bring your co-pay if you have one as well as your insurance cards. [facility] is located at [address]. Should you need to reschedule your appointment, please contact them at [phone].  You have been scheduled for a CT scan of the abdomen and pelvis at [place] ([address]---this is in the same building as [employer]).   You are scheduled on 07-12-2019 at [place] should arrive 15 minutes prior to your appointment time for registration. Please follow the written instructions below on the day of your exam:  WARNING: IF YOU ARE ALLERGIC TO IODINE/X-RAY DYE, PLEASE NOTIFY RADIOLOGY IMMEDIATELY AT [phone]! YOU WILL BE GIVEN A 13 HOUR PREMEDICATION PREP.  1) Do not eat or drink anything after 6am (4 hours prior to your test) 2) You have been given 2 bottles of oral contrast to drink. The solution may taste better if refrigerated, but do NOT add ice or any other liquid to this solution. Shake well before drinking.    Drink 1 bottle of contrast @ 8am (2 hours prior to your exam)  Drink 1 bottle of contrast @ 9am (1 hour prior to your exam)  You may take any medications as prescribed with a small amount of water,  if necessary. If you take any of the following medications: METFORMIN, GLUCOPHAGE, GLUCOVANCE, AVANDAMET, RIOMET, FORTAMET, [place] MET, JANUMET, GLUMETZA or METAGLIP, you MAY be asked to HOLD this medication 48 hours AFTER the exam.  The purpose of you drinking the oral contrast is to aid in the visualization of your intestinal tract. The contrast solution may cause some diarrhea. Depending on your individual set of symptoms, you may also receive an intravenous injection of x-ray contrast/dye. Plan on being at [facility] for 30 minutes or longer, depending on the type of exam you are having performed.  This test typically takes 30-45 minutes to complete.  If you have any questions regarding your exam or if you need to reschedule, you may call the CT department at [phone] between the hours of 8:00 am and 5:00 pm, Monday-Friday.  ________________________________________________________________________      It was a pleasure to see you today!  Ellouise Newer,  PA-C

## 2019-07-13 NOTE — Progress Notes (Signed)
Reviewed and agree with documentation and assessment and plan. K. Veena Myrical Andujo , MD   

## 2019-07-13 NOTE — Progress Notes (Signed)
Chief Complaint: Vomiting, abdominal pain, atypical chest pain, known large hiatal hernia  HPI:    Lisa Galloway is a 59 year old African-American female with a past medical history as listed below, who was referred to me by Ladell Pier, MD for a complaint of vomiting, abdominal pain, atypical chest pain and known large hiatal hernia.      02/17/2016 EGD with large hiatal hernia, dilation in the lower third of the esophagus, no gross lesions in the stomach, normal duodenum, at that time she was referred to Dr. Hassell Done for hiatal hernia repair.  Colonoscopy on the same day with nonbleeding internal hemorrhoids and otherwise normal.  Repeat recommended in 5 years for screening due to family history of colon cancer.    04/05/2019 ER visit for chest pain.  CT of the chest showed a moderate sized hiatal hernia containing a portion of the stomach and mild cardiomegaly.  Patient did follow-up with a cardiologist and coronary CTA was ordered for further eval.    06/28/2019 CTA showed large hiatal hernia and no other extracardiac findings.  No evidence of CAD.    Today, the patient presents to clinic and explains that over the past few years she has had trouble with some vomiting, this has gotten to the point where she can no longer control it and it happens 4 out of 7 days of the week where she may have just eaten or maybe she goes to lay down and she vomits up/regurgitates her food.  There is no preceding nausea.  Typically this is "chunks of food that I may have eaten earlier that morning or that I have just eaten".  Tells me that she has to sleep with "a bunch of pillows", if she lays flat at all she will have vomiting.  Along with this has uncontrolled reflux and heartburn symptoms for which she sometimes uses 6 Omeprazole 20 mg at a time.  Tells me that 20 mg twice a day "does not cut it".  Also describes the pain in her chest which she has been told is not cardiac in origin.    Also complains of pain on  the sides of her abdomen and tells me that these areas "pooch out" if she yawns too deep or moves/stretches in the wrong position and she has to hold them and push them back in, she thinks they are hernias.    Denies fever, chills, weight loss or blood in her stool.  Past Medical History:  Diagnosis Date  . Allergy    HEY FEVER  . Anemia   . Asthma   . GERD (gastroesophageal reflux disease)   . Hayfever     History reviewed. No pertinent surgical history.  Current Outpatient Medications  Medication Sig Dispense Refill  . albuterol (PROAIR HFA) 108 (90 Base) MCG/ACT inhaler Inhale 2 puffs into the lungs every 6 (six) hours as needed for wheezing or shortness of breath. 18 g 0  . IRON PO Take 1 tablet by mouth daily.     . metFORMIN (GLUCOPHAGE) 500 MG tablet Take 1 tablet (500 mg total) by mouth daily with breakfast. 30 tablet 6  . Multiple Vitamin (MULTIVITAMIN WITH MINERALS) TABS tablet Take 1 tablet by mouth daily.    . famotidine (PEPCID) 40 MG tablet Take 1 tablet (40 mg total) by mouth 2 (two) times daily. 60 tablet 5  . omeprazole (PRILOSEC) 40 MG capsule Take 1 capsule (40 mg total) by mouth 2 (two) times daily. 60 capsule 5  No current facility-administered medications for this visit.    Allergies as of 07/13/2019 - Review Complete 07/13/2019  Allergen Reaction Noted  . Ginger Hives 07/19/2015  . Latex Itching 07/19/2015  . Oxycodone Nausea And Vomiting and Other (See Comments) 07/19/2015    Family History  Problem Relation Age of Onset  . Kidney disease Mother   . Diabetes Mother   . Other Mother        cardiomegaly  . Hypertension Sister   . Diabetes Sister   . Hypertension Brother   . Diabetes Brother   . Alcoholism Brother   . Hypertension Sister   . Diabetes Sister   . Colon cancer Maternal Grandmother   . Colon cancer Maternal Aunt   . Sleep apnea Other     Social History   Socioeconomic History  . Marital status: Married    Spouse name: Not on  file  . Number of children: 2  . Years of education: Not on file  . Highest education level: Not on file  Occupational History  . Occupation: nurse  Tobacco Use  . Smoking status: Never Smoker  . Smokeless tobacco: Never Used  Substance and Sexual Activity  . Alcohol use: No  . Drug use: No  . Sexual activity: Yes  Other Topics Concern  . Not on file  Social History Narrative  . Not on file   Social Determinants of Health   Financial Resource Strain:   . Difficulty of Paying Living Expenses: Not on file  Food Insecurity:   . Worried About Charity fundraiser in the Last Year: Not on file  . Ran Out of Food in the Last Year: Not on file  Transportation Needs:   . Lack of Transportation (Medical): Not on file  . Lack of Transportation (Non-Medical): Not on file  Physical Activity:   . Days of Exercise per Week: Not on file  . Minutes of Exercise per Session: Not on file  Stress:   . Feeling of Stress : Not on file  Social Connections:   . Frequency of Communication with Friends and Family: Not on file  . Frequency of Social Gatherings with Friends and Family: Not on file  . Attends Religious Services: Not on file  . Active Member of Clubs or Organizations: Not on file  . Attends Archivist Meetings: Not on file  . Marital Status: Not on file  Intimate Partner Violence:   . Fear of Current or Ex-Partner: Not on file  . Emotionally Abused: Not on file  . Physically Abused: Not on file  . Sexually Abused: Not on file    Review of Systems:    Constitutional: No weight loss, fever or chills Skin: No rash  Cardiovascular: +atypical chest pain Respiratory: No SOB  Gastrointestinal: See HPI and otherwise negative Genitourinary: No dysuria Neurological: No headache Musculoskeletal: No new muscle or joint pain Hematologic: No bleeding  Psychiatric: No history of depression or anxiety   Physical Exam:  Vital signs: BP 130/80   Pulse 80   Temp (!) 97.4 F  (36.3 C)   Ht 5\' 5"  (1.651 m)   Wt 222 lb 12.8 oz (101.1 kg)   BMI 37.08 kg/m   Constitutional:   Pleasant obese AA female appears to be in NAD, Well developed, Well nourished, alert and cooperative Head:  Normocephalic and atraumatic. Eyes:   PEERL, EOMI. No icterus. Conjunctiva pink. Ears:  Normal auditory acuity. Neck:  Supple Throat: Oral cavity and pharynx without  inflammation, swelling or lesion.  Respiratory: Respirations even and unlabored. Lungs clear to auscultation bilaterally.   No wheezes, crackles, or rhonchi.  Cardiovascular: Normal S1, S2. No MRG. Regular rate and rhythm. No peripheral edema, cyanosis or pallor.  Gastrointestinal:  Soft, nondistended, Mild lateral abdominal ttp, did not appreciate any abdominal wall hernia, No rebound or guarding. Normal bowel sounds. No appreciable masses or hepatomegaly. Rectal:  Not performed.  Msk:  Symmetrical without gross deformities. Without edema, no deformity or joint abnormality.  Neurologic:  Alert and  oriented x4;  grossly normal neurologically.  Skin:   Dry and intact without significant lesions or rashes. Psychiatric: Demonstrates good judgement and reason without abnormal affect or behaviors.  MOST RECENT LABS AND IMAGING: CBC    Component Value Date/Time   WBC 8.3 04/05/2019 1337   RBC 4.83 04/05/2019 1337   HGB 11.6 (L) 04/05/2019 1337   HGB 11.1 10/12/2016 1544   HCT 38.2 04/05/2019 1337   HCT 37.4 10/12/2016 1544   PLT 370 04/05/2019 1337   PLT 382 (H) 10/12/2016 1544   MCV 79.1 (L) 04/05/2019 1337   MCV 79 10/12/2016 1544   MCH 24.0 (L) 04/05/2019 1337   MCHC 30.4 04/05/2019 1337   RDW 18.5 (H) 04/05/2019 1337   RDW 18.5 (H) 10/12/2016 1544    CMP     Component Value Date/Time   NA 142 06/09/2019 1605   K 4.4 06/09/2019 1605   CL 107 (H) 06/09/2019 1605   CO2 24 06/09/2019 1605   GLUCOSE 93 06/09/2019 1605   GLUCOSE 113 (H) 04/05/2019 1337   BUN 13 06/09/2019 1605   CREATININE 0.80 06/09/2019  1605   CALCIUM 9.4 06/09/2019 1605   PROT 7.1 06/05/2019 1111   ALBUMIN 4.2 06/05/2019 1111   AST 29 06/05/2019 1111   ALT 32 06/05/2019 1111   ALKPHOS 126 (H) 06/05/2019 1111   BILITOT <0.2 06/05/2019 1111   GFRNONAA 82 06/09/2019 1605   GFRAA 95 06/09/2019 1605    Assessment: 1.  Large hiatal hernia: Seen on EGD in 2017, again seen on CT of the chest in November 2020, now patient with atypical chest pain and episodes of vomiting which are noncardiac in origin ; likely hiatal hernia  2.  Vomiting: With above 3.  Atypical chest pain: With above 4.  Generalized abdominal pain: Uncertain etiology, patient wants to know if she has any abdominal wall hernias  Plan: 1.  Ordered CT of the abdomen and pelvis with contrast for further evaluation of abdominal pain in past patient's question of hernias in her abdominal wall, though these were not appreciated time of exam today. 2.  Referred the patient to [facility] for her symptomatic large hiatal hernia.  Patient tells me she never got a call from their office last time.  Told her to check in with [place] if she has not heard from them by Monday. 3.  Educated the patient on how taking 6 Omeprazole 20 mg at a time can be bad for her.  Instead we will change her to Omeprazole 40 mg twice daily.  Prescribed #60 with 5 refills. 4.  We will also add Famotidine 40 mg every morning and nightly #60 with 5 refills. 5.  Reviewed antireflux diet and lifestyle modifications. 6.  Patient to follow with [place] as needed after hiatal hernia repair.  Ellouise Newer, PA-C [facility] 07/13/2019, 4:08 PM  Cc: Ladell Pier, MD

## 2019-07-14 ENCOUNTER — Telehealth: Payer: Self-pay

## 2019-07-14 NOTE — Telephone Encounter (Signed)
Records have been faxed to [facility] will await appointment info.

## 2019-07-17 ENCOUNTER — Other Ambulatory Visit: Payer: Self-pay

## 2019-07-17 ENCOUNTER — Ambulatory Visit (INDEPENDENT_AMBULATORY_CARE_PROVIDER_SITE_OTHER)
Admission: RE | Admit: 2019-07-17 | Discharge: 2019-07-17 | Disposition: A | Discharge status: Home / Self Care | Source: Ambulatory Visit | Attending: Physician Assistant | Admitting: Physician Assistant

## 2019-07-17 DIAGNOSIS — R0789 Other chest pain: Secondary | ICD-10-CM | POA: Diagnosis not present

## 2019-07-17 DIAGNOSIS — K219 Gastro-esophageal reflux disease without esophagitis: Secondary | ICD-10-CM

## 2019-07-17 DIAGNOSIS — K449 Diaphragmatic hernia without obstruction or gangrene: Secondary | ICD-10-CM

## 2019-07-17 DIAGNOSIS — R1084 Generalized abdominal pain: Secondary | ICD-10-CM

## 2019-07-17 MED ORDER — IOHEXOL 300 MG/ML  SOLN
100.0000 mL | Freq: Once | INTRAMUSCULAR | Status: AC | PRN
  Administered 2019-07-17: 11:00:00 100 mL via INTRAVENOUS

## 2019-07-18 ENCOUNTER — Telehealth: Payer: Self-pay | Admitting: Internal Medicine

## 2019-07-18 DIAGNOSIS — D259 Leiomyoma of uterus, unspecified: Secondary | ICD-10-CM

## 2019-07-18 DIAGNOSIS — D271 Benign neoplasm of left ovary: Secondary | ICD-10-CM

## 2019-07-18 NOTE — Telephone Encounter (Signed)
Will forward to pcp

## 2019-07-18 NOTE — Telephone Encounter (Signed)
Lisa Galloway from [facility] called requesting for the pcp to review the patients most recent ct scan with their office. She stated the patient had a CT scan of the abdomen and pelvis w/ contrast yesterday 07/17/2019. 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

## 2019-07-18 NOTE — Telephone Encounter (Signed)
Phone call placed to patient this evening.  I informed her of the results of the recent CAT scan that she had done that was ordered by the gastroenterologist.  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  I explained to her what a teratoma is.  Patient is postmenopausal so the fibroid is not concerning.  However variant teratomas are usually benign but do have the potential to become malignant especially in women over the age of 50.  For this reason I will refer her to gynecology for further evaluation and management.  Patient expressed understanding and is agreeable with the plan.

## 2019-07-19 NOTE — Telephone Encounter (Signed)
Per Judson Roch at [facility], patient has been scheduled with Dr Michael Boston on 08/01/19 at 845 am.

## 2019-07-28 ENCOUNTER — Ambulatory Visit: Admitting: Internal Medicine

## 2019-08-21 ENCOUNTER — Ambulatory Visit: Payer: Self-pay | Admitting: Surgery

## 2019-08-22 ENCOUNTER — Telehealth: Payer: Self-pay | Admitting: *Deleted

## 2019-08-22 NOTE — Telephone Encounter (Signed)
PRIMARY  CARDIOLOGIST [facility] Pre-operative Risk Assessment    Request for surgical clearance:  1. What type of surgery is being performed?  HIATAL HERNIA REPAIR w/FUNDOPLICATION    2. When is this surgery scheduled? TBD  3. What type of clearance is required (medical clearance vs. Pharmacy clearance to hold med vs. Both)?  MEDICAL   4. Are there any medications that need to be held prior to surgery and how long? N/A   5. Practice name and name of physician performing surgery?  [facility] ; DR Remo Lipps [facility]    6. What is your office phone number  [phone]    7.   What is your office fax number [phone] , [place]  8.   Anesthesia type (None, local, MAC, general) ? GENERAL   Lisa Galloway 08/22/2019, 5:16 PM  _________________________________________________________________   (provider comments below)

## 2019-08-23 ENCOUNTER — Encounter: Payer: Self-pay | Admitting: Obstetrics and Gynecology

## 2019-08-23 ENCOUNTER — Other Ambulatory Visit: Payer: Self-pay

## 2019-08-23 ENCOUNTER — Ambulatory Visit (INDEPENDENT_AMBULATORY_CARE_PROVIDER_SITE_OTHER): Admitting: Obstetrics and Gynecology

## 2019-08-23 VITALS — BP 127/84 | HR 78 | Ht 65.0 in | Wt 225.9 lb

## 2019-08-23 DIAGNOSIS — D259 Leiomyoma of uterus, unspecified: Secondary | ICD-10-CM

## 2019-08-23 DIAGNOSIS — Z1151 Encounter for screening for human papillomavirus (HPV): Secondary | ICD-10-CM | POA: Diagnosis not present

## 2019-08-23 DIAGNOSIS — D219 Benign neoplasm of connective and other soft tissue, unspecified: Secondary | ICD-10-CM

## 2019-08-23 DIAGNOSIS — D271 Benign neoplasm of left ovary: Secondary | ICD-10-CM

## 2019-08-23 DIAGNOSIS — Z124 Encounter for screening for malignant neoplasm of cervix: Secondary | ICD-10-CM

## 2019-08-23 DIAGNOSIS — D369 Benign neoplasm, unspecified site: Secondary | ICD-10-CM | POA: Insufficient documentation

## 2019-08-23 NOTE — Telephone Encounter (Signed)
Pt had atypical chest pain evaluated in 06/2019 with coronary CTA showing normal coronary arteries with no plaque. CP likely related to her hiatal hernia.  I called to update status and clear pt.  Message left on VM for pt to call back and ask for preop clinic.

## 2019-08-24 ENCOUNTER — Telehealth: Payer: Self-pay

## 2019-08-24 ENCOUNTER — Other Ambulatory Visit: Payer: Self-pay | Admitting: Surgery

## 2019-08-24 ENCOUNTER — Other Ambulatory Visit (HOSPITAL_COMMUNITY): Payer: Self-pay | Admitting: Surgery

## 2019-08-24 ENCOUNTER — Telehealth: Payer: Self-pay | Admitting: Gastroenterology

## 2019-08-24 ENCOUNTER — Ambulatory Visit: Admitting: Internal Medicine

## 2019-08-24 DIAGNOSIS — R1112 Projectile vomiting: Secondary | ICD-10-CM

## 2019-08-24 NOTE — Progress Notes (Signed)
Lisa Galloway presents in referral to CT results in Dec. CT scan was ordered by GI. CT noted uterine fibroids and left ovarian dermoid cyst. She denies any pelvic pain. No sexual active. No bowel or bladder dysfunction. Last pap 2017 LMP several yrs ago. Occ mild menopausal Sx  PE AF VSS Lungs clear Heart RRR Abd soft + BS GU Nl EGBUS, cervix no lesions, uterus small mobile < 10 weeks, no adnexal tenderness  A/P Left ovarian dermoid cyst        Uterine fibroids  Dx reviewed with pt. Will check GYN U/S. F/U after U/S to discuss results and treatment

## 2019-08-24 NOTE — Telephone Encounter (Signed)
Called pt to advise of Mammogram appointment on 09/05/19 @ 4:30p, no answer, left VM of [facility] phone # and address, also day & time of appointment.

## 2019-08-24 NOTE — Telephone Encounter (Signed)
   Primary Cardiologist: Quay Burow, MD  Chart reviewed as part of pre-operative protocol coverage. Patient was contacted 08/24/2019 in reference to pre-operative risk assessment for pending surgery as outlined below.  Lisa Galloway was last seen on 06/09/2019 by Dr. Gwenlyn Found for initial cardiac evaluation. She had an echocardiogram done on 06/21/19 that showed normal LV systolic function with EF 55-60%, no LVH or significant valvular abnormalities.  She also underwent coronary CTA that showed coronary calcium score of 0 with no evidence of CAD. Since that day, Lisa Galloway has continued to have chest discomfort-constant ache not related to activity, vomiting-sometimes projectile and trouble getting her food down. She often has burning acid as it bubbles up, worse when she lays flat. Noted to have large hiatal hernia by EGD in 01/2016. CT abd/chest 07/17/2019 showed stable large hiatal hernia. Her symptoms are more consistent with GI origin.  Therefore, based on ACC/AHA guidelines, the patient would be at acceptable risk for the planned procedure without further cardiovascular testing.   I will route this recommendation to the requesting party via Epic fax function and remove from pre-op pool.  Please call with questions.  Daune Perch, NP 08/24/2019, 2:44 PM

## 2019-08-24 NOTE — Telephone Encounter (Signed)
Received referral for this patient to be scheduled for Manometry with Dr. Silverio Decamp

## 2019-08-25 NOTE — Telephone Encounter (Signed)
Do you have the records? Who is the referral from? Thanks

## 2019-08-28 LAB — CYTOLOGY - PAP
Comment: NEGATIVE
Diagnosis: NEGATIVE
High risk HPV: POSITIVE — AB

## 2019-08-30 ENCOUNTER — Other Ambulatory Visit: Payer: Self-pay

## 2019-08-31 ENCOUNTER — Ambulatory Visit
Admission: RE | Admit: 2019-08-31 | Discharge: 2019-08-31 | Disposition: A | Discharge status: Home / Self Care | Source: Ambulatory Visit | Attending: Obstetrics and Gynecology | Admitting: Obstetrics and Gynecology

## 2019-08-31 ENCOUNTER — Encounter: Payer: Self-pay | Admitting: Obstetrics and Gynecology

## 2019-08-31 ENCOUNTER — Telehealth: Payer: Self-pay | Admitting: *Deleted

## 2019-08-31 DIAGNOSIS — R87810 Cervical high risk human papillomavirus (HPV) DNA test positive: Secondary | ICD-10-CM | POA: Insufficient documentation

## 2019-08-31 DIAGNOSIS — D219 Benign neoplasm of connective and other soft tissue, unspecified: Secondary | ICD-10-CM

## 2019-08-31 NOTE — Telephone Encounter (Signed)
I called Tela and informed her of results and reccomendations per Dr.Ervin. She voices understanding.  Dariana Garbett,RN

## 2019-08-31 NOTE — Telephone Encounter (Signed)
-----   Message from Chancy Milroy, MD sent at 08/31/2019  1:27 PM EDT ----- Please let Ms Bui know that her pap smear was normal except for HPV. Will repeat pap smear in 1 yr.  Thanks Legrand Como

## 2019-09-05 ENCOUNTER — Ambulatory Visit
Admission: RE | Admit: 2019-09-05 | Discharge: 2019-09-05 | Disposition: A | Discharge status: Home / Self Care | Source: Ambulatory Visit | Attending: Internal Medicine | Admitting: Internal Medicine

## 2019-09-05 ENCOUNTER — Telehealth: Payer: Self-pay

## 2019-09-05 ENCOUNTER — Other Ambulatory Visit: Payer: Self-pay

## 2019-09-05 DIAGNOSIS — Z1231 Encounter for screening mammogram for malignant neoplasm of breast: Secondary | ICD-10-CM

## 2019-09-05 NOTE — Telephone Encounter (Signed)
-----   Message from Chancy Milroy, MD sent at 08/31/2019  5:45 PM EDT ----- Please let Ms Tornetta know that her GYN U/S revealed uterine fibroids only.  Her ovaries are normal. As she is asymptomatic form her fibroids no additional treatment is necessary. She can follow up as needed.  Thanks Legrand Como

## 2019-09-05 NOTE — Telephone Encounter (Signed)
Called patient to advise of [place] results of Uterine Fibroids & that her Ovaries are normal. Patient verbalized understanding.

## 2019-09-08 ENCOUNTER — Telehealth: Payer: Self-pay

## 2019-09-08 NOTE — Telephone Encounter (Signed)
Contacted pt to go over MM results pt didn't answer left a detailed vm informing pt of results and if she has any questions or concerns to give a call  

## 2019-09-11 ENCOUNTER — Ambulatory Visit (HOSPITAL_COMMUNITY)
Admission: RE | Admit: 2019-09-11 | Discharge: 2019-09-11 | Disposition: A | Discharge status: Home / Self Care | Source: Ambulatory Visit | Attending: Surgery | Admitting: Surgery

## 2019-09-11 ENCOUNTER — Other Ambulatory Visit: Payer: Self-pay

## 2019-09-11 DIAGNOSIS — R1112 Projectile vomiting: Secondary | ICD-10-CM | POA: Diagnosis present

## 2019-09-11 MED ORDER — TECHNETIUM TC 99M SULFUR COLLOID FILTERED
2.0000 | Freq: Once | INTRAVENOUS | Status: AC | PRN
  Administered 2019-09-11: 2 via INTRADERMAL

## 2019-09-16 ENCOUNTER — Other Ambulatory Visit: Payer: Self-pay | Admitting: Nurse Practitioner

## 2019-09-16 DIAGNOSIS — J4520 Mild intermittent asthma, uncomplicated: Secondary | ICD-10-CM

## 2019-09-29 ENCOUNTER — Other Ambulatory Visit (HOSPITAL_COMMUNITY): Admission: RE | Admit: 2019-09-29 | Source: Ambulatory Visit

## 2019-09-30 ENCOUNTER — Other Ambulatory Visit (HOSPITAL_COMMUNITY): Admission: RE | Admit: 2019-09-30 | Source: Ambulatory Visit

## 2019-10-03 ENCOUNTER — Other Ambulatory Visit (HOSPITAL_COMMUNITY)
Admission: RE | Admit: 2019-10-03 | Discharge: 2019-10-03 | Disposition: A | Discharge status: Home / Self Care | Source: Ambulatory Visit | Attending: Gastroenterology | Admitting: Gastroenterology

## 2019-10-03 DIAGNOSIS — Z20822 Contact with and (suspected) exposure to covid-19: Secondary | ICD-10-CM | POA: Diagnosis not present

## 2019-10-03 DIAGNOSIS — Z01812 Encounter for preprocedural laboratory examination: Secondary | ICD-10-CM | POA: Diagnosis not present

## 2019-10-03 LAB — SARS CORONAVIRUS 2 (TAT 6-24 HRS): SARS Coronavirus 2: NEGATIVE

## 2019-10-04 ENCOUNTER — Encounter (HOSPITAL_COMMUNITY)
Admission: RE | Disposition: A | Discharge status: Home / Self Care | Payer: Self-pay | Source: Home / Self Care | Attending: Gastroenterology

## 2019-10-04 ENCOUNTER — Encounter (HOSPITAL_COMMUNITY): Payer: Self-pay | Admitting: Gastroenterology

## 2019-10-04 ENCOUNTER — Ambulatory Visit (HOSPITAL_COMMUNITY)
Admission: RE | Admit: 2019-10-04 | Discharge: 2019-10-04 | Disposition: A | Discharge status: Home / Self Care | Attending: Gastroenterology | Admitting: Gastroenterology

## 2019-10-04 DIAGNOSIS — R079 Chest pain, unspecified: Secondary | ICD-10-CM | POA: Insufficient documentation

## 2019-10-04 DIAGNOSIS — R1110 Vomiting, unspecified: Secondary | ICD-10-CM

## 2019-10-04 DIAGNOSIS — R0789 Other chest pain: Secondary | ICD-10-CM | POA: Diagnosis not present

## 2019-10-04 DIAGNOSIS — R112 Nausea with vomiting, unspecified: Secondary | ICD-10-CM | POA: Insufficient documentation

## 2019-10-04 HISTORY — PX: ESOPHAGEAL MANOMETRY: SHX5429

## 2019-10-04 SURGERY — MANOMETRY, ESOPHAGUS
Anesthesia: Choice

## 2019-10-04 MED ORDER — LIDOCAINE VISCOUS HCL 2 % MT SOLN
OROMUCOSAL | Status: AC
  Filled 2019-10-04: qty 15

## 2019-10-04 SURGICAL SUPPLY — 2 items
FACESHIELD LNG OPTICON STERILE (SAFETY) IMPLANT
GLOVE BIO SURGEON STRL SZ8 (GLOVE) ×6 IMPLANT

## 2019-10-04 NOTE — Progress Notes (Signed)
Esophageal manometry performed per protocol without complications.  Patient tolerated well. 

## 2019-10-05 ENCOUNTER — Encounter: Payer: Self-pay | Admitting: *Deleted

## 2019-10-20 ENCOUNTER — Telehealth: Payer: Self-pay

## 2019-10-20 NOTE — Telephone Encounter (Signed)
Patient called stating she was suppose to have surgery and was advice some orders had to be done first. Patient states the orders has been done for quite away and has not heard anything. Patient would like to know if PCP knows what she needs to do or who she needs to contact.  Please advice [phone]

## 2019-10-20 NOTE — Telephone Encounter (Signed)
Please contact pt and schedule a surgery clearance appt. Can take an appointment for Monday

## 2019-10-23 ENCOUNTER — Ambulatory Visit (HOSPITAL_COMMUNITY): Payer: Self-pay | Admitting: Surgery

## 2019-10-27 DIAGNOSIS — R1110 Vomiting, unspecified: Secondary | ICD-10-CM

## 2019-12-21 NOTE — Patient Instructions (Addendum)
DUE TO COVID-19 ONLY ONE VISITOR IS ALLOWED TO COME WITH YOU AND STAY IN THE WAITING ROOM ONLY DURING PRE OP AND PROCEDURE DAY OF SURGERY. THE 1 VISITOR MAY VISIT WITH YOU AFTER SURGERY IN YOUR PRIVATE ROOM DURING VISITING HOURS ONLY!  YOU NEED TO HAVE A COVID 19 TEST ON: 01/01/20 @ 10:00 am, THIS TEST MUST BE DONE BEFORE SURGERY, COME  [address]. [place]. [place]. [address]. ONCE YOUR COVID TEST IS COMPLETED, PLEASE BEGIN THE QUARANTINE INSTRUCTIONS AS OUTLINED IN YOUR HANDOUT.                Lisa Galloway   Your procedure is scheduled on: 01/04/20   Report to [facility] Main  Entrance   Report to admitting at: 5:30 AM     Call this number if you have problems the morning of surgery [phone]    Remember: Do not eat food or drink liquids :After Midnight.   BRUSH YOUR TEETH MORNING OF SURGERY AND RINSE YOUR MOUTH OUT, NO CHEWING GUM CANDY OR MINTS.     Take these medicines the morning of surgery with A SIP OF WATER: famotidine,omeprazole.  How to Manage Your Diabetes Before and After Surgery  Why is it important to control my blood sugar before and after surgery? . Improving blood sugar levels before and after surgery helps healing and can limit problems. . A way of improving blood sugar control is eating a healthy diet by: o  Eating less sugar and carbohydrates o  Increasing activity/exercise o  Talking with your doctor about reaching your blood sugar goals . High blood sugars (greater than 180 mg/dL) can raise your risk of infections and slow your recovery, so you will need to focus on controlling your diabetes during the weeks before surgery. . Make sure that the doctor who takes care of your diabetes knows about your planned surgery including the date and location.  How do I manage my blood sugar before surgery? . Check your blood sugar at least 4 times a day, starting 2 days before surgery, to make sure that the level is not too high or low. o Check  your blood sugar the morning of your surgery when you wake up and every 2 hours until you get to the Short Stay unit. . If your blood sugar is less than 70 mg/dL, you will need to treat for low blood sugar: o Do not take insulin. o Treat a low blood sugar (less than 70 mg/dL) with  cup of clear juice (cranberry or apple), 4 glucose tablets, OR glucose gel. o Recheck blood sugar in 15 minutes after treatment (to make sure it is greater than 70 mg/dL). If your blood sugar is not greater than 70 mg/dL on recheck, call [phone] for further instructions. . Report your blood sugar to the short stay nurse when you get to Short Stay.  . If you are admitted to the hospital after surgery: o Your blood sugar will be checked by the staff and you will probably be given insulin after surgery (instead of oral diabetes medicines) to make sure you have good blood sugar levels. o The goal for blood sugar control after surgery is 80-180 mg/dL.   WHAT DO I DO ABOUT MY DIABETES MEDICATION?  Marland Kitchen Do not take oral diabetes medicines (pills) the morning of surgery.  . THE DAY BEFORE SURGERY, take Metformin as usual.       . THE MORNING OF SURGERY, DO NOT take Metformin.  DO NOT TAKE ANY DIABETIC MEDICATIONS DAY OF YOUR SURGERY                               You may not have any metal on your body including hair pins and              piercings  Do not wear jewelry, make-up, lotions, powders or perfumes, deodorant             Do not wear nail polish on your fingernails.  Do not shave  48 hours prior to surgery.               Do not bring valuables to the hospital. [place].  Contacts, dentures or bridgework may not be worn into surgery.  Leave suitcase in the car. After surgery it may be brought to your room.     Patients discharged the day of surgery will not be allowed to drive home. IF YOU ARE HAVING SURGERY AND GOING HOME THE SAME DAY, YOU MUST HAVE AN  ADULT TO DRIVE YOU HOME AND BE WITH YOU FOR 24 HOURS. YOU MAY GO HOME BY TAXI OR UBER OR ORTHERWISE, BUT AN ADULT MUST ACCOMPANY YOU HOME AND STAY WITH YOU FOR 24 HOURS.  Name and phone number of your driver:  Special Instructions: N/A              Please read over the following fact sheets you were given: _____________________________________________________________________      [facility] - Preparing for Surgery Before surgery, you can play an important role.  Because skin is not sterile, your skin needs to be as free of germs as possible.  You can reduce the number of germs on your skin by washing with CHG (chlorahexidine gluconate) soap before surgery.  CHG is an antiseptic cleaner which kills germs and bonds with the skin to continue killing germs even after washing. Please DO NOT use if you have an allergy to CHG or antibacterial soaps.  If your skin becomes reddened/irritated stop using the CHG and inform your nurse when you arrive at Short Stay. Do not shave (including legs and underarms) for at least 48 hours prior to the first CHG shower.  You may shave your face/neck. Please follow these instructions carefully:  1.  Shower with CHG Soap the night before surgery and the  morning of Surgery.  2.  If you choose to wash your hair, wash your hair first as usual with your  normal  shampoo.  3.  After you shampoo, rinse your hair and body thoroughly to remove the  shampoo.                           4.  Use CHG as you would any other liquid soap.  You can apply chg directly  to the skin and wash                       Gently with a scrungie or clean washcloth.  5.  Apply the CHG Soap to your body ONLY FROM THE NECK DOWN.   Do not use on face/ open  Wound or open sores. Avoid contact with eyes, ears mouth and genitals (private parts).                       Wash face,  Genitals (private parts) with your normal soap.             6.  Wash thoroughly, paying special  attention to the area where your surgery  will be performed.  7.  Thoroughly rinse your body with warm water from the neck down.  8.  DO NOT shower/wash with your normal soap after using and rinsing off  the CHG Soap.                9.  Pat yourself dry with a clean towel.            10.  Wear clean pajamas.            11.  Place clean sheets on your bed the night of your first shower and do not  sleep with pets. Day of Surgery : Do not apply any lotions/deodorants the morning of surgery.  Please wear clean clothes to the hospital/surgery center.  FAILURE TO FOLLOW THESE INSTRUCTIONS MAY RESULT IN THE CANCELLATION OF YOUR SURGERY PATIENT SIGNATURE_________________________________  NURSE SIGNATURE__________________________________  ________________________________________________________________________

## 2019-12-26 ENCOUNTER — Encounter (HOSPITAL_COMMUNITY)
Admission: RE | Admit: 2019-12-26 | Discharge: 2019-12-26 | Disposition: A | Discharge status: Home / Self Care | Source: Ambulatory Visit | Attending: Surgery | Admitting: Surgery

## 2019-12-26 ENCOUNTER — Other Ambulatory Visit: Payer: Self-pay

## 2019-12-26 ENCOUNTER — Encounter (HOSPITAL_COMMUNITY): Payer: Self-pay

## 2019-12-26 DIAGNOSIS — Z01812 Encounter for preprocedural laboratory examination: Secondary | ICD-10-CM | POA: Diagnosis not present

## 2019-12-26 HISTORY — DX: Dyspnea, unspecified: R06.00

## 2019-12-26 HISTORY — DX: Prediabetes: R73.03

## 2019-12-26 HISTORY — DX: Unspecified osteoarthritis, unspecified site: M19.90

## 2019-12-26 HISTORY — DX: Angina pectoris, unspecified: I20.9

## 2019-12-26 LAB — BASIC METABOLIC PANEL
Anion gap: 8 (ref 5–15)
BUN: 17 mg/dL (ref 6–20)
CO2: 26 mmol/L (ref 22–32)
Calcium: 8.8 mg/dL — ABNORMAL LOW (ref 8.9–10.3)
Chloride: 110 mmol/L (ref 98–111)
Creatinine, Ser: 0.87 mg/dL (ref 0.44–1.00)
GFR calc Af Amer: >60 mL/min (ref >60)
GFR calc non Af Amer: >60 mL/min (ref >60)
Glucose, Bld: 100 mg/dL — ABNORMAL HIGH (ref 70–99)
Potassium: 4.5 mmol/L (ref 3.5–5.1)
Sodium: 144 mmol/L (ref 135–145)

## 2019-12-26 LAB — HEMOGLOBIN A1C
Hgb A1c MFr Bld: 6.5 % — ABNORMAL HIGH (ref 4.8–5.6)
Mean Plasma Glucose: 139.85 mg/dL

## 2019-12-26 LAB — CBC
HCT: 36.9 % (ref 36.0–46.0)
Hemoglobin: 11.2 g/dL — ABNORMAL LOW (ref 12.0–15.0)
MCH: 24.4 pg — ABNORMAL LOW (ref 26.0–34.0)
MCHC: 30.4 g/dL (ref 30.0–36.0)
MCV: 80.4 fL (ref 80.0–100.0)
Platelets: 366 10*3/uL (ref 150–400)
RBC: 4.59 MIL/uL (ref 3.87–5.11)
RDW: 19.2 % — ABNORMAL HIGH (ref 11.5–15.5)
WBC: 8 10*3/uL (ref 4.0–10.5)
nRBC: 0 % (ref 0.0–0.2)

## 2019-12-26 LAB — GLUCOSE, CAPILLARY: Glucose-Capillary: 99 mg/dL (ref 70–99)

## 2019-12-26 NOTE — Progress Notes (Signed)
COVID Vaccine Completed:yes Date COVID Vaccine completed: COVID vaccine manufacturer: [employer]   PCP - Dr. Karle Plumber. Cardiologist - Quay Burow. LOV: 06/09/19.  Chest x-ray -  EKG - 04/05/20. EPIC Stress Test -  ECHO - 06/21/19. EPIC Cardiac Cath -   Sleep Study -  CPAP -   Fasting Blood Sugar - 100's Checks Blood Sugar  1_ times a day  Blood Thinner Instructions: Aspirin Instructions: Last Dose:  Anesthesia review: Hx: chest pain,as per pt. This happens several times specially when she walks long distances or on exertion.Pt. has steps at home and she said she goes up on two flays of steps several times during the day,but she gets SOB.  Patient denies shortness of breath, fever, cough and chest pain at PAT appointment   Patient verbalized understanding of instructions that were given to them at the PAT appointment. Patient was also instructed that they will need to review over the PAT instructions again at home before surgery.

## 2020-01-01 ENCOUNTER — Other Ambulatory Visit (HOSPITAL_COMMUNITY)
Admission: RE | Admit: 2020-01-01 | Discharge: 2020-01-01 | Disposition: A | Discharge status: Home / Self Care | Source: Ambulatory Visit | Attending: Surgery | Admitting: Surgery

## 2020-01-01 DIAGNOSIS — Z01812 Encounter for preprocedural laboratory examination: Secondary | ICD-10-CM | POA: Insufficient documentation

## 2020-01-01 DIAGNOSIS — Z20822 Contact with and (suspected) exposure to covid-19: Secondary | ICD-10-CM | POA: Insufficient documentation

## 2020-01-01 LAB — SARS CORONAVIRUS 2 (TAT 6-24 HRS): SARS Coronavirus 2: NEGATIVE

## 2020-01-03 MED ORDER — BUPIVACAINE LIPOSOME 1.3 % IJ SUSP
20.0000 mL | INTRAMUSCULAR | Status: DC
  Filled 2020-01-03: qty 20

## 2020-01-03 NOTE — Anesthesia Preprocedure Evaluation (Addendum)
Anesthesia Evaluation  Patient identified by MRN, date of birth, ID band Patient awake    Reviewed: Allergy & Precautions, NPO status , Patient's Chart, lab work & pertinent test results  History of Anesthesia Complications Negative for: history of anesthetic complications  Airway Mallampati: II  TM Distance: >3 FB Neck ROM: Full    Dental  (+) Dental Advisory Given   Pulmonary asthma ,    Pulmonary exam normal        Cardiovascular negative cardio ROS Normal cardiovascular exam   '21 TTE - dilated LA, otherwise normal echo    Neuro/Psych negative neurological ROS  negative psych ROS   GI/Hepatic Neg liver ROS, hiatal hernia, GERD  Medicated,  Endo/Other   Obesity Pre-DM on metformin   Renal/GU negative Renal ROS     Musculoskeletal  (+) Arthritis ,   Abdominal (+) + obese,   Peds  Hematology negative hematology ROS (+)   Anesthesia Other Findings Covid test negative   Reproductive/Obstetrics                            Anesthesia Physical Anesthesia Plan  ASA: III  Anesthesia Plan: General   Post-op Pain Management:    Induction: Intravenous and Rapid sequence  PONV Risk Score and Plan: 4 or greater and Treatment may vary due to age or medical condition, Ondansetron, Scopolamine patch - Pre-op, Midazolam and Dexamethasone  Airway Management Planned: Oral ETT  Additional Equipment: None  Intra-op Plan:   Post-operative Plan: Extubation in OR  Informed Consent: I have reviewed the patients History and Physical, chart, labs and discussed the procedure including the risks, benefits and alternatives for the proposed anesthesia with the patient or authorized representative who has indicated his/her understanding and acceptance.     Dental advisory given  Plan Discussed with: CRNA and Anesthesiologist  Anesthesia Plan Comments:        Anesthesia Quick  Evaluation

## 2020-01-03 NOTE — H&P (Addendum)
Lisa Galloway Documented: 08/21/2019 11:12 AM Location: [facility] Patient #: [id number] DOB: 20-Apr-1961 Married / Language: English / Race: Black or African American Female   History of Present Illness Adin Hector MD; 08/21/2019 12:59 PM) The patient is a 59 year old female who presents with a hiatal hernia. Note for "Hiatal hernia": ` ` ` Patient sent for surgical consultation at the request of Dr Stevphen [facility]  Chief Complaint: Marland Kitchen The patient is a obese woman in the struggle with upper abdominal and chest pain for a decade. She is to live in [place], [place]. Relocated  in 2017. Noted to have a hiatal hernia by EGD back in [place]. She recalls someone telling her she had pleurisy as well. She thought she had given records to her primary care physician but we cannot find them. She saw gastroenterology in 2017. Hiatal hernia confirmed of moderate size according to EGD. Surgical referral recommended to see Dr. Hassell Done. That did not happen. There is discussion of manometry. That did not happen. Apparently with recurrent symptoms she discussed with gastroenterology again this year. Surgical consultation offered. Was supposed to be initially in March Pacino shown. Rescheduled and she is here today.   She comes today by herself. She has many complaints. She notes that solid food will stick. She gets heartburn and reflux even with liquids including water per she is on a proton pump inhibitor as well as an H2 blocker both twice a day without much relief in symptoms. Intravenously Pontillo Spear she notes that several times a week she will wake up retching. Has had a couple episodes of projectile vomiting. She will have episodes of chest pain. Sometimes in the right flank going along her chest wall. She had one episode that scared her so she went to the emergency room. They ruled out myocardial infarction, but cardiac consultation was made.  Decent echocardiogram. CT chest had a low calcium score, arguing against any coronary disease. Again surgical consultation offered.  Patient notes she could usually walk 20 minutes. She is to walk an hour a day with a treadmill and on a track years ago but not recently. Has a history of asthma. She using moves her bowels every day. No prior abdominal surgery. She is on metformin for a strong family history of diabetes. She does not think she has diabetes and itself.  No personal nor family history of GI/colon cancer, inflammatory bowel disease, irritable bowel syndrome, allergy such as Celiac Sprue, dietary/dairy problems, colitis, ulcers nor gastritis. No recent sick contacts/gastroenteritis. No travel outside the country. No changes in diet. No hematochezia, hematemesis, coffee ground emesis. No evidence of prior gastric/peptic ulceration.  (Review of systems as stated in this history (HPI) or in the review of systems. Otherwise all other 12 point ROS are negative) ` ` `  This patient encounter took 65 minutes today to perform the following: obtain history, perform exam, review outside records, interpret tests & imaging, counsel the patient on their diagnosis; and, document this encounter, including findings & plan in the electronic health record (EHR).   Allergies (Chanel Teressa Senter, CMA; 08/21/2019 11:13 AM) No Known Allergies  [08/21/2019]: No Known Drug Allergies  [08/21/2019]: Allergies Reconciled   Medication History (Chanel Teressa Senter, CMA; 08/21/2019 11:14 AM) Iron (Oral) Specific strength unknown - Active. Multiple Minerals-Vitamins (Oral) Active. Pepcid (Oral) Specific strength unknown - Active. Omeprazole (40MG Capsule DR, Oral) Active. ProAir HFA (108 (90 Base)MCG/ACT Aerosol Soln, Inhalation) Active. metFORMIN HCl (500MG Tablet, Oral) Active.  Medications Reconciled  Vitals (Chanel Nolan CMA; 08/21/2019 11:15 AM) 08/21/2019 11:14 AM Weight: 226.25 lb Height:  65in Body Surface Area: 2.08 m Body Mass Index: 37.65 kg/m  Temp.: 97.71F  Pulse: 108 (Regular)  BP: 126/76(Sitting, Left Arm, Standard)       Physical Exam Adin Hector MD; 08/21/2019 12:51 PM) General Mental Status-Alert. General Appearance-Not in acute distress, Not Sickly. Orientation-Oriented X3. Hydration-Well hydrated. Voice-Normal. Note: Primarily pear-shaped body habitus. Moves easily. Pleasant. Chatty. Interrupts often, but not to the point of mania   Integumentary Global Assessment Upon inspection and palpation of skin surfaces of the - Axillae: non-tender, no inflammation or ulceration, no drainage. and Distribution of scalp and body hair is normal. General Characteristics Temperature - normal warmth is noted.  Head and Neck Head-normocephalic, atraumatic with no lesions or palpable masses. Face Global Assessment - atraumatic, no absence of expression. Neck Global Assessment - no abnormal movements, no bruit auscultated on the right, no bruit auscultated on the left, no decreased range of motion, non-tender. Trachea-midline. Thyroid Gland Characteristics - non-tender.  Eye Eyeball - Left-Extraocular movements intact, No Nystagmus - Left. Eyeball - Right-Extraocular movements intact, No Nystagmus - Right. Cornea - Left-No Hazy - Left. Cornea - Right-No Hazy - Right. Sclera/Conjunctiva - Left-No scleral icterus, No Discharge - Left. Sclera/Conjunctiva - Right-No scleral icterus, No Discharge - Right. Pupil - Left-Direct reaction to light normal. Pupil - Right-Direct reaction to light normal.  ENMT Ears Pinna - Left - no drainage observed, no generalized tenderness observed. Pinna - Right - no drainage observed, no generalized tenderness observed. Nose and Sinuses External Inspection of the Nose - no destructive lesion observed. Inspection of the nares - Left - quiet respiration. Inspection of the nares - Right -  quiet respiration. Mouth and Throat Lips - Upper Lip - no fissures observed, no pallor noted. Lower Lip - no fissures observed, no pallor noted. Nasopharynx - no discharge present. Oral Cavity/Oropharynx - Tongue - no dryness observed. Oral Mucosa - no cyanosis observed. Hypopharynx - no evidence of airway distress observed.  Chest and Lung Exam Inspection Movements - Normal and Symmetrical. Accessory muscles - No use of accessory muscles in breathing. Palpation Palpation of the chest reveals - Non-tender. Auscultation Breath sounds - Normal and Clear.  Cardiovascular Auscultation Rhythm - Regular. Murmurs & Other Heart Sounds - Auscultation of the heart reveals - No Murmurs and No Systolic Clicks.  Abdomen Inspection Inspection of the abdomen reveals - No Visible peristalsis and No Abnormal pulsations. Umbilicus - No Bleeding, No Urine drainage. Palpation/Percussion Palpation and Percussion of the abdomen reveal - Soft, Non Tender, No Rebound tenderness, No Rigidity (guarding) and No Cutaneous hyperesthesia. Note: Abdomen obese but soft. Mild diastases recti. All she notes that she is occasionally had pain along her right flank to chest wall, she is not tender right now no guarding. No peritonitis. NondistendedNo umbilical or other anterior abdominal wall hernias   Female Genitourinary Sexual Maturity Tanner 5 - Adult hair pattern. Note: No vaginal bleeding nor discharge   Peripheral Vascular Upper Extremity Inspection - Left - No Cyanotic nailbeds - Left, Not Ischemic. Inspection - Right - No Cyanotic nailbeds - Right, Not Ischemic.  Neurologic Neurologic evaluation reveals -normal attention span and ability to concentrate, able to name objects and repeat phrases. Appropriate fund of knowledge , normal sensation and normal coordination. Mental Status Affect - not angry, not paranoid. Cranial Nerves-Normal Bilaterally. Gait-Normal.  Neuropsychiatric Mental status  exam performed with findings of-able to articulate well  with normal speech/language, rate, volume and coherence, thought content normal with ability to perform basic computations and apply abstract reasoning and no evidence of hallucinations, delusions, obsessions or homicidal/suicidal ideation.  Musculoskeletal Global Assessment Spine, Ribs and Pelvis - no instability, subluxation or laxity. Right Upper Extremity - no instability, subluxation or laxity.  Lymphatic Head & Neck  General Head & Neck Lymphatics: Bilateral - Description - No Localized lymphadenopathy. Axillary  General Axillary Region: Bilateral - Description - No Localized lymphadenopathy. Femoral & Inguinal  Generalized Femoral & Inguinal Lymphatics: Left - Description - No Localized lymphadenopathy. Right - Description - No Localized lymphadenopathy.    Assessment & Plan Adin Hector MD; 08/21/2019 1:00 PM) PARAESOPHAGEAL HIATAL HERNIA (K44.9) Impression: Obvious hiatal hernia. Not particularly giant on CT of chest.  Symptoms of dysphagia with nausea and vomiting, even projectile vomiting of uncertain etiology.  I suspect that it is her Hiatal hernia, but she has some symptoms NOT triggered by eating.  I would recommend esophageal manometry to rule out any primary esophageal disorder. This had been recommended 2017. She noted no one called to schedule. I gave her my card and noted we will work to try and schedule. If she did not anything by next week, please call us so she does not wait to hear. Use to rule out a primary esophageal disorder. Also help decide if she could tolerate a fundoplication  I recommended gastric emptying study to rule out delayed gastric emptying since she's had vomiting up food and other issues. May need concurrent pyloroplasty versus medical treatment.  Would want a double check cardiac clearance. She just saw Dr. Adora Fridge, I am assuming he would feel she could tolerate an operation since  she did not have any obvious dysfunction on echocardiogram already concerning calcium score on CT of chest.  Standard of care would be minimally invasive hiatal hernia repair with fundoplication. With her obesity would need Phasix absorbable mesh reinforcement. Recurrence rate is going to be higher with her elevated BMI. I discussed with her considering seeing the bariatric surgeons to see if a Roux-en-Y bypass would be a way to control her heartburn and reflux with also weight loss to allow hernia recurrence to be minimal. She was not ready to consider that path  I allso strongly recommend she get [place] records from when she lived in [place] since I cannot pull them out through the [facility] health [facility]. She believes she has an endoscopy and workup in the past. Current Plans Follow Up - Call [facility] office after tests / studies doneto discuss further plans The anatomy & physiology of the foregut and anti-reflux mechanism was discussed. The pathophysiology of hiatal herniation and GERD was discussed. Natural history risks without surgery was discussed. The patient's symptoms are not adequately controlled by medicines and other non-operative treatments. I feel the risks of no intervention will lead to serious problems that outweigh the operative risks; therefore, I recommended surgery to reduce the hiatal hernia out of the chest and fundoplication to rebuild the anti-reflux valve and control reflux better. Need for a thorough workup to rule out the differential diagnosis and plan treatment was explained. I explained laparoscopic techniques with possible need for an open approach.  Risks such as bleeding, infection, abscess, leak, need for further treatment, heart attack, death, and other risks were discussed. I noted a good likelihood this will help address the problem. Goals of post-operative recovery were discussed as well. Possibility that this will not correct all symptoms was explained.  Post-operative  dysphagia, need for short-term liquid & pureed diet, inability to vomit, possibility of reherniation, possible need for medicines to help control symptoms in addition to surgery were discussed. We will work to minimize complications. Educational handouts further explaining the pathology, treatment options, and dysphagia diet was given as well. Questions were answered. The patient expresses understanding & agrees with completing the workup before discussing options  I recommended to the patient that they have an evaluation of esophageal motility with manometry by endoscopy unit under the supervision of gastroenterology. I recommended obtaining preoperative cardiac clearance. I am concerned about the health of the patient and the ability to tolerate the operation. Therefore, we will request clearance by cardiology to better assess operative risk & see if a reevaluation, further workup, etc is needed. Also recommendations on how medications such as for anticoagulation and blood pressure should be managed/held/restarted after surgery. ESOPHAGEAL DYSPHAGIA (R13.10) Impression: Esophageal dysphagia of uncertain etiology. No obvious obstructing tumor on EGD. Obtain manometry. PROJECTILE VOMITING WITH NAUSEA (R11.12) Impression: Rule out gastroparesis. May be that her hiatal hernia is incarcerated & volvulizing CHEST PAIN, ATYPICAL (R07.89) Impression: Chest pain noncardiac according to cardiac workup. Perhaps related to hiatal hernia OBESITY (BMI 35.0-39.9 WITHOUT COMORBIDITY) (E66.9) Impression: Her obesity increases the risk of hiatal hernia recurrence should we proceed with surgery. Ideally would have Roux-en-Y for help with weight loss and heartburn control. Making hiatal hernia recurrent less.       > From: Adin Hector MD > To: Illene Regulus CMA > Sent: 10/23/2019 1:29 PM > Gastric emptying study shows normal emptying without delay.  Manometry shows no primary esophageal  dysmotility. Adequate bolus clearance. Essentially normal.  In the absence of other etiologies for her symptoms, reasonable to offer robotic hiatal hernia repair with Nissen fundoplication in hopes that can help get her symptoms under control. Better long-term results with Roux-en-Y gastric bypass, but she was not willing to consider that.  Sounds like she wishes to proceed with surgery. I completed orders.. Double check with her. I can go over the surgical technique etc. again if she wishes, but I felt like she was ready to proceed when I first met her.  Patient was considering bariatric surgery for time but feels like her hiatal hernia symptoms are worse and wishes to proceed with [facility] hernia repair.  Robotic approach.  Should be able to tolerate complete fundoplication.  No gastroparesis.

## 2020-01-04 ENCOUNTER — Inpatient Hospital Stay (HOSPITAL_COMMUNITY)
Admission: RE | Admit: 2020-01-04 | Discharge: 2020-01-06 | DRG: 328 | Disposition: A | Discharge status: Home / Self Care | Attending: Surgery | Admitting: Surgery

## 2020-01-04 ENCOUNTER — Encounter (HOSPITAL_COMMUNITY): Payer: Self-pay | Admitting: Surgery

## 2020-01-04 ENCOUNTER — Ambulatory Visit (HOSPITAL_COMMUNITY): Admitting: Anesthesiology

## 2020-01-04 ENCOUNTER — Encounter (HOSPITAL_COMMUNITY)
Admission: RE | Disposition: A | Discharge status: Home / Self Care | Payer: Self-pay | Source: Ambulatory Visit | Attending: Surgery

## 2020-01-04 DIAGNOSIS — Z91018 Allergy to other foods: Secondary | ICD-10-CM

## 2020-01-04 DIAGNOSIS — K219 Gastro-esophageal reflux disease without esophagitis: Secondary | ICD-10-CM | POA: Diagnosis present

## 2020-01-04 DIAGNOSIS — Z20822 Contact with and (suspected) exposure to covid-19: Secondary | ICD-10-CM | POA: Diagnosis present

## 2020-01-04 DIAGNOSIS — Z7984 Long term (current) use of oral hypoglycemic drugs: Secondary | ICD-10-CM

## 2020-01-04 DIAGNOSIS — Z9889 Other specified postprocedural states: Secondary | ICD-10-CM

## 2020-01-04 DIAGNOSIS — Z833 Family history of diabetes mellitus: Secondary | ICD-10-CM | POA: Diagnosis not present

## 2020-01-04 DIAGNOSIS — R87810 Cervical high risk human papillomavirus (HPV) DNA test positive: Secondary | ICD-10-CM | POA: Diagnosis present

## 2020-01-04 DIAGNOSIS — Z885 Allergy status to narcotic agent status: Secondary | ICD-10-CM

## 2020-01-04 DIAGNOSIS — R7303 Prediabetes: Secondary | ICD-10-CM | POA: Diagnosis present

## 2020-01-04 DIAGNOSIS — M1990 Unspecified osteoarthritis, unspecified site: Secondary | ICD-10-CM | POA: Diagnosis present

## 2020-01-04 DIAGNOSIS — K440 Diaphragmatic hernia with obstruction, without gangrene: Principal | ICD-10-CM | POA: Insufficient documentation

## 2020-01-04 DIAGNOSIS — Z6839 Body mass index (BMI) 39.0-39.9, adult: Secondary | ICD-10-CM | POA: Diagnosis not present

## 2020-01-04 DIAGNOSIS — Z79899 Other long term (current) drug therapy: Secondary | ICD-10-CM | POA: Diagnosis not present

## 2020-01-04 DIAGNOSIS — Z91040 Latex allergy status: Secondary | ICD-10-CM

## 2020-01-04 DIAGNOSIS — E669 Obesity, unspecified: Secondary | ICD-10-CM | POA: Diagnosis present

## 2020-01-04 DIAGNOSIS — R1110 Vomiting, unspecified: Secondary | ICD-10-CM | POA: Diagnosis present

## 2020-01-04 DIAGNOSIS — R0789 Other chest pain: Secondary | ICD-10-CM | POA: Diagnosis present

## 2020-01-04 DIAGNOSIS — J4520 Mild intermittent asthma, uncomplicated: Secondary | ICD-10-CM | POA: Diagnosis present

## 2020-01-04 DIAGNOSIS — K3189 Other diseases of stomach and duodenum: Secondary | ICD-10-CM | POA: Diagnosis present

## 2020-01-04 DIAGNOSIS — D219 Benign neoplasm of connective and other soft tissue, unspecified: Secondary | ICD-10-CM | POA: Diagnosis present

## 2020-01-04 HISTORY — PX: XI ROBOTIC ASSISTED PARAESOPHAGEAL HERNIA REPAIR: SHX6871

## 2020-01-04 LAB — GLUCOSE, CAPILLARY
Glucose-Capillary: 100 mg/dL — ABNORMAL HIGH (ref 70–99)
Glucose-Capillary: 146 mg/dL — ABNORMAL HIGH (ref 70–99)

## 2020-01-04 SURGERY — REPAIR, HERNIA, PARAESOPHAGEAL, ROBOT-ASSISTED
Anesthesia: General | Site: Abdomen

## 2020-01-04 MED ORDER — GABAPENTIN 300 MG PO CAPS
300.0000 mg | ORAL_CAPSULE | Freq: Two times a day (BID) | ORAL | Status: DC
  Administered 2020-01-04 – 2020-01-06 (×4): 300 mg via ORAL
  Filled 2020-01-04 (×4): qty 1

## 2020-01-04 MED ORDER — METOPROLOL TARTRATE 5 MG/5ML IV SOLN: 5.0000 mg | Freq: Four times a day (QID) | INTRAVENOUS | Status: DC | PRN

## 2020-01-04 MED ORDER — LACTATED RINGERS IV SOLN: INTRAVENOUS | Status: DC

## 2020-01-04 MED ORDER — SODIUM CHLORIDE 0.9% FLUSH
3.0000 mL | Freq: Two times a day (BID) | INTRAVENOUS | Status: DC
  Administered 2020-01-04 – 2020-01-05 (×2): 3 mL via INTRAVENOUS

## 2020-01-04 MED ORDER — OXYCODONE HCL 5 MG PO TABS: 5.0000 mg | ORAL_TABLET | ORAL | Status: DC | PRN

## 2020-01-04 MED ORDER — PROPOFOL 10 MG/ML IV BOLUS
INTRAVENOUS | Status: DC | PRN
  Administered 2020-01-04: 200 mg via INTRAVENOUS

## 2020-01-04 MED ORDER — ONDANSETRON HCL 4 MG/2ML IJ SOLN
4.0000 mg | Freq: Four times a day (QID) | INTRAMUSCULAR | Status: DC | PRN
  Administered 2020-01-05: 14:00:00 4 mg via INTRAVENOUS
  Filled 2020-01-04: qty 2

## 2020-01-04 MED ORDER — LIP MEDEX EX OINT
1.0000 "application " | TOPICAL_OINTMENT | Freq: Two times a day (BID) | CUTANEOUS | Status: DC
  Administered 2020-01-04 – 2020-01-06 (×5): 1 via TOPICAL
  Filled 2020-01-04: qty 7

## 2020-01-04 MED ORDER — ENSURE PRE-SURGERY PO LIQD
592.0000 mL | Freq: Once | ORAL | Status: DC
  Filled 2020-01-04: qty 592

## 2020-01-04 MED ORDER — ALBUTEROL SULFATE (2.5 MG/3ML) 0.083% IN NEBU: 3.0000 mL | INHALATION_SOLUTION | Freq: Four times a day (QID) | RESPIRATORY_TRACT | Status: DC | PRN

## 2020-01-04 MED ORDER — PROMETHAZINE HCL 25 MG RE SUPP
25.0000 mg | Freq: Four times a day (QID) | RECTAL | 5 refills | Status: DC | PRN
Start: 2020-01-04 — End: 2022-07-09

## 2020-01-04 MED ORDER — SCOPOLAMINE 1 MG/3DAYS TD PT72
1.0000 | MEDICATED_PATCH | TRANSDERMAL | Status: DC
  Administered 2020-01-04: 1.5 mg via TRANSDERMAL
  Filled 2020-01-04: qty 1

## 2020-01-04 MED ORDER — ENOXAPARIN SODIUM 40 MG/0.4ML ~~LOC~~ SOLN
40.0000 mg | SUBCUTANEOUS | Status: DC
  Administered 2020-01-05 – 2020-01-06 (×2): 40 mg via SUBCUTANEOUS
  Filled 2020-01-04 (×2): qty 0.4

## 2020-01-04 MED ORDER — ZOLPIDEM TARTRATE 5 MG PO TABS: 5.0000 mg | ORAL_TABLET | Freq: Every evening | ORAL | Status: DC | PRN

## 2020-01-04 MED ORDER — FAMOTIDINE IN NACL 20-0.9 MG/50ML-% IV SOLN
20.0000 mg | Freq: Two times a day (BID) | INTRAVENOUS | Status: DC
  Administered 2020-01-04 – 2020-01-05 (×2): 20 mg via INTRAVENOUS
  Filled 2020-01-04 (×2): qty 50

## 2020-01-04 MED ORDER — LIP MEDEX EX OINT
TOPICAL_OINTMENT | CUTANEOUS | Status: AC
  Filled 2020-01-04: qty 7

## 2020-01-04 MED ORDER — MAGIC MOUTHWASH
15.0000 mL | Freq: Four times a day (QID) | ORAL | Status: DC | PRN
  Filled 2020-01-04: qty 15

## 2020-01-04 MED ORDER — BISACODYL 10 MG RE SUPP
10.0000 mg | Freq: Every day | RECTAL | Status: DC | PRN
  Administered 2020-01-05: 20:00:00 10 mg via RECTAL
  Filled 2020-01-04: qty 1

## 2020-01-04 MED ORDER — FENTANYL CITRATE (PF) 250 MCG/5ML IJ SOLN
INTRAMUSCULAR | Status: AC
  Filled 2020-01-04: qty 5

## 2020-01-04 MED ORDER — LACTATED RINGERS IV SOLN: 1000.0000 mL | INTRAVENOUS | Status: DC

## 2020-01-04 MED ORDER — LIDOCAINE 2% (20 MG/ML) 5 ML SYRINGE
INTRAMUSCULAR | Status: DC | PRN
  Administered 2020-01-04: 100 mg via INTRAVENOUS

## 2020-01-04 MED ORDER — METRONIDAZOLE IN NACL 5-0.79 MG/ML-% IV SOLN
500.0000 mg | INTRAVENOUS | Status: AC
  Administered 2020-01-04: 500 mg via INTRAVENOUS
  Filled 2020-01-04: qty 100

## 2020-01-04 MED ORDER — PANTOPRAZOLE SODIUM 40 MG PO TBEC
80.0000 mg | DELAYED_RELEASE_TABLET | Freq: Every day | ORAL | Status: DC
  Administered 2020-01-04 – 2020-01-06 (×3): 80 mg via ORAL
  Filled 2020-01-04 (×3): qty 2

## 2020-01-04 MED ORDER — ROCURONIUM BROMIDE 10 MG/ML (PF) SYRINGE
PREFILLED_SYRINGE | INTRAVENOUS | Status: AC
  Filled 2020-01-04: qty 10

## 2020-01-04 MED ORDER — MIDAZOLAM HCL 5 MG/5ML IJ SOLN
INTRAMUSCULAR | Status: DC | PRN
  Administered 2020-01-04: 2 mg via INTRAVENOUS

## 2020-01-04 MED ORDER — ONDANSETRON HCL 4 MG/2ML IJ SOLN
INTRAMUSCULAR | Status: DC | PRN
  Administered 2020-01-04: 4 mg via INTRAVENOUS

## 2020-01-04 MED ORDER — MIDAZOLAM HCL 2 MG/2ML IJ SOLN
INTRAMUSCULAR | Status: AC
  Filled 2020-01-04: qty 2

## 2020-01-04 MED ORDER — SUCCINYLCHOLINE CHLORIDE 200 MG/10ML IV SOSY
PREFILLED_SYRINGE | INTRAVENOUS | Status: DC | PRN
Start: 2020-01-04 — End: 2020-01-04
  Administered 2020-01-04: 120 mg via INTRAVENOUS

## 2020-01-04 MED ORDER — PHENYLEPHRINE 40 MCG/ML (10ML) SYRINGE FOR IV PUSH (FOR BLOOD PRESSURE SUPPORT)
PREFILLED_SYRINGE | INTRAVENOUS | Status: DC | PRN
  Administered 2020-01-04 (×4): 80 ug via INTRAVENOUS

## 2020-01-04 MED ORDER — DEXAMETHASONE SODIUM PHOSPHATE 4 MG/ML IJ SOLN: 4.0000 mg | INTRAMUSCULAR | Status: DC

## 2020-01-04 MED ORDER — METFORMIN HCL 500 MG PO TABS
500.0000 mg | ORAL_TABLET | Freq: Every day | ORAL | Status: DC
  Administered 2020-01-05 – 2020-01-06 (×2): 500 mg via ORAL
  Filled 2020-01-04 (×2): qty 1

## 2020-01-04 MED ORDER — CELECOXIB 200 MG PO CAPS
200.0000 mg | ORAL_CAPSULE | ORAL | Status: AC
  Administered 2020-01-04: 200 mg via ORAL
  Filled 2020-01-04: qty 1

## 2020-01-04 MED ORDER — SODIUM CHLORIDE 0.9 % IV SOLN: INTRAVENOUS | Status: DC | PRN

## 2020-01-04 MED ORDER — 0.9 % SODIUM CHLORIDE (POUR BTL) OPTIME
TOPICAL | Status: DC | PRN
  Administered 2020-01-04: 1000 mL

## 2020-01-04 MED ORDER — HYDROCODONE-ACETAMINOPHEN 5-325 MG PO TABS: 1.0000 | ORAL_TABLET | Freq: Four times a day (QID) | ORAL | 0 refills | Status: DC | PRN

## 2020-01-04 MED ORDER — BUPIVACAINE-EPINEPHRINE 0.25% -1:200000 IJ SOLN
INTRAMUSCULAR | Status: DC | PRN
  Administered 2020-01-04: 60 mL

## 2020-01-04 MED ORDER — ROCURONIUM BROMIDE 10 MG/ML (PF) SYRINGE
PREFILLED_SYRINGE | INTRAVENOUS | Status: DC | PRN
  Administered 2020-01-04: 20 mg via INTRAVENOUS
  Administered 2020-01-04: 60 mg via INTRAVENOUS
  Administered 2020-01-04: 20 mg via INTRAVENOUS

## 2020-01-04 MED ORDER — DEXAMETHASONE SODIUM PHOSPHATE 10 MG/ML IJ SOLN
INTRAMUSCULAR | Status: DC | PRN
Start: 2020-01-04 — End: 2020-01-04
  Administered 2020-01-04: 5 mg via INTRAVENOUS

## 2020-01-04 MED ORDER — ONDANSETRON 4 MG PO TBDP: 4.0000 mg | ORAL_TABLET | Freq: Four times a day (QID) | ORAL | Status: DC | PRN

## 2020-01-04 MED ORDER — POLYETHYLENE GLYCOL 3350 17 G PO PACK
17.0000 g | PACK | Freq: Every day | ORAL | Status: DC | PRN
  Administered 2020-01-06: 17 g via ORAL
  Filled 2020-01-04: qty 1

## 2020-01-04 MED ORDER — SODIUM CHLORIDE 0.9 % IV SOLN
2.0000 g | INTRAVENOUS | Status: AC
  Administered 2020-01-04: 2 g via INTRAVENOUS
  Filled 2020-01-04: qty 20

## 2020-01-04 MED ORDER — HYDROMORPHONE HCL 1 MG/ML IJ SOLN
0.5000 mg | INTRAMUSCULAR | Status: DC | PRN
  Administered 2020-01-04 – 2020-01-05 (×2): 1 mg via INTRAVENOUS
  Filled 2020-01-04 (×2): qty 1

## 2020-01-04 MED ORDER — METHOCARBAMOL 1000 MG/10ML IJ SOLN
1000.0000 mg | Freq: Four times a day (QID) | INTRAVENOUS | Status: DC | PRN
  Filled 2020-01-04: qty 10

## 2020-01-04 MED ORDER — ENSURE PRE-SURGERY PO LIQD
296.0000 mL | Freq: Once | ORAL | Status: DC
  Filled 2020-01-04: qty 296

## 2020-01-04 MED ORDER — PROCHLORPERAZINE EDISYLATE 10 MG/2ML IJ SOLN: 5.0000 mg | Freq: Four times a day (QID) | INTRAMUSCULAR | Status: DC | PRN

## 2020-01-04 MED ORDER — SODIUM CHLORIDE 0.9 % IV SOLN: INTRAVENOUS | Status: DC

## 2020-01-04 MED ORDER — ONDANSETRON HCL 4 MG PO TABS
4.0000 mg | ORAL_TABLET | Freq: Three times a day (TID) | ORAL | 5 refills | Status: DC | PRN
Start: 2020-01-04 — End: 2022-07-09

## 2020-01-04 MED ORDER — DEXAMETHASONE SODIUM PHOSPHATE 4 MG/ML IJ SOLN
4.0000 mg | Freq: Two times a day (BID) | INTRAMUSCULAR | Status: DC
  Administered 2020-01-04 – 2020-01-06 (×5): 4 mg via INTRAVENOUS
  Filled 2020-01-04 (×5): qty 1

## 2020-01-04 MED ORDER — SIMETHICONE 80 MG PO CHEW
40.0000 mg | CHEWABLE_TABLET | Freq: Four times a day (QID) | ORAL | Status: DC | PRN
  Administered 2020-01-05: 40 mg via ORAL
  Filled 2020-01-04: qty 1

## 2020-01-04 MED ORDER — METHOCARBAMOL 500 MG PO TABS
750.0000 mg | ORAL_TABLET | Freq: Four times a day (QID) | ORAL | Status: DC | PRN
  Administered 2020-01-04 – 2020-01-05 (×3): 750 mg via ORAL
  Filled 2020-01-04 (×3): qty 2

## 2020-01-04 MED ORDER — DIPHENHYDRAMINE HCL 12.5 MG/5ML PO ELIX: 12.5000 mg | ORAL_SOLUTION | Freq: Four times a day (QID) | ORAL | Status: DC | PRN

## 2020-01-04 MED ORDER — ALBUMIN HUMAN 5 % IV SOLN
12.5000 g | Freq: Four times a day (QID) | INTRAVENOUS | Status: DC | PRN
  Filled 2020-01-04: qty 250

## 2020-01-04 MED ORDER — DIPHENHYDRAMINE HCL 50 MG/ML IJ SOLN: 12.5000 mg | Freq: Four times a day (QID) | INTRAMUSCULAR | Status: DC | PRN

## 2020-01-04 MED ORDER — FENTANYL CITRATE (PF) 100 MCG/2ML IJ SOLN
25.0000 ug | INTRAMUSCULAR | Status: DC | PRN
  Administered 2020-01-04: 50 ug via INTRAVENOUS

## 2020-01-04 MED ORDER — ACETAMINOPHEN 500 MG PO TABS
1000.0000 mg | ORAL_TABLET | Freq: Three times a day (TID) | ORAL | Status: DC
  Administered 2020-01-04 – 2020-01-06 (×5): 1000 mg via ORAL
  Filled 2020-01-04 (×5): qty 2

## 2020-01-04 MED ORDER — BUPIVACAINE LIPOSOME 1.3 % IJ SUSP
INTRAMUSCULAR | Status: DC | PRN
  Administered 2020-01-04: 20 mL

## 2020-01-04 MED ORDER — EPHEDRINE SULFATE 50 MG/ML IJ SOLN
INTRAMUSCULAR | Status: DC | PRN
  Administered 2020-01-04: 5 mg via INTRAVENOUS
  Administered 2020-01-04: 15 mg via INTRAVENOUS

## 2020-01-04 MED ORDER — LIDOCAINE 2% (20 MG/ML) 5 ML SYRINGE
INTRAMUSCULAR | Status: AC
  Filled 2020-01-04: qty 5

## 2020-01-04 MED ORDER — PROCHLORPERAZINE MALEATE 10 MG PO TABS
10.0000 mg | ORAL_TABLET | Freq: Four times a day (QID) | ORAL | Status: DC | PRN
  Filled 2020-01-04: qty 1

## 2020-01-04 MED ORDER — ADULT MULTIVITAMIN W/MINERALS CH
1.0000 | ORAL_TABLET | Freq: Every day | ORAL | Status: DC
  Administered 2020-01-04 – 2020-01-06 (×3): 1 via ORAL
  Filled 2020-01-04 (×3): qty 1

## 2020-01-04 MED ORDER — SODIUM CHLORIDE 0.9 % IV SOLN: 250.0000 mL | INTRAVENOUS | Status: DC | PRN

## 2020-01-04 MED ORDER — ORAL CARE MOUTH RINSE: 15.0000 mL | Freq: Once | OROMUCOSAL | Status: AC

## 2020-01-04 MED ORDER — ARTIFICIAL TEARS OPHTHALMIC OINT
TOPICAL_OINTMENT | OPHTHALMIC | Status: AC
  Filled 2020-01-04: qty 3.5

## 2020-01-04 MED ORDER — SUGAMMADEX SODIUM 200 MG/2ML IV SOLN
INTRAVENOUS | Status: DC | PRN
  Administered 2020-01-04: 200 mg via INTRAVENOUS

## 2020-01-04 MED ORDER — FENTANYL CITRATE (PF) 100 MCG/2ML IJ SOLN
INTRAMUSCULAR | Status: AC
  Administered 2020-01-04: 50 ug via INTRAVENOUS
  Filled 2020-01-04: qty 2

## 2020-01-04 MED ORDER — FENTANYL CITRATE (PF) 250 MCG/5ML IJ SOLN
INTRAMUSCULAR | Status: DC | PRN
  Administered 2020-01-04 (×2): 50 ug via INTRAVENOUS
  Administered 2020-01-04: 100 ug via INTRAVENOUS
  Administered 2020-01-04: 50 ug via INTRAVENOUS

## 2020-01-04 MED ORDER — DICLOFENAC SODIUM 1 % EX GEL
2.0000 g | Freq: Every day | CUTANEOUS | Status: DC | PRN
  Filled 2020-01-04: qty 100

## 2020-01-04 MED ORDER — CHLORHEXIDINE GLUCONATE 0.12 % MT SOLN
15.0000 mL | Freq: Once | OROMUCOSAL | Status: AC
  Administered 2020-01-04: 15 mL via OROMUCOSAL

## 2020-01-04 MED ORDER — SODIUM CHLORIDE 0.9% FLUSH: 3.0000 mL | INTRAVENOUS | Status: DC | PRN

## 2020-01-04 MED ORDER — ENALAPRILAT 1.25 MG/ML IV SOLN
0.6250 mg | Freq: Four times a day (QID) | INTRAVENOUS | Status: DC | PRN
  Filled 2020-01-04: qty 1

## 2020-01-04 MED ORDER — DEXAMETHASONE SODIUM PHOSPHATE 10 MG/ML IJ SOLN
INTRAMUSCULAR | Status: AC
  Filled 2020-01-04: qty 1

## 2020-01-04 MED ORDER — ACETAMINOPHEN 500 MG PO TABS
1000.0000 mg | ORAL_TABLET | ORAL | Status: AC
  Administered 2020-01-04: 1000 mg via ORAL
  Filled 2020-01-04: qty 2

## 2020-01-04 MED ORDER — SODIUM CHLORIDE 0.9 % IV SOLN
25.0000 mg | Freq: Four times a day (QID) | INTRAVENOUS | Status: DC | PRN
  Filled 2020-01-04: qty 1

## 2020-01-04 MED ORDER — PHENYLEPHRINE 40 MCG/ML (10ML) SYRINGE FOR IV PUSH (FOR BLOOD PRESSURE SUPPORT)
PREFILLED_SYRINGE | INTRAVENOUS | Status: AC
  Filled 2020-01-04: qty 10

## 2020-01-04 MED ORDER — GABAPENTIN 300 MG PO CAPS
300.0000 mg | ORAL_CAPSULE | ORAL | Status: AC
  Administered 2020-01-04: 300 mg via ORAL
  Filled 2020-01-04: qty 1

## 2020-01-04 MED ORDER — LACTATED RINGERS IR SOLN
Status: DC | PRN
  Administered 2020-01-04: 1000 mL

## 2020-01-04 MED ORDER — FERROUS SULFATE 325 (65 FE) MG PO TABS
325.0000 mg | ORAL_TABLET | Freq: Every day | ORAL | Status: DC
  Administered 2020-01-05 – 2020-01-06 (×2): 325 mg via ORAL
  Filled 2020-01-04 (×2): qty 1

## 2020-01-04 MED ORDER — PROPOFOL 10 MG/ML IV BOLUS
INTRAVENOUS | Status: AC
  Filled 2020-01-04: qty 20

## 2020-01-04 MED ORDER — BUPIVACAINE-EPINEPHRINE (PF) 0.25% -1:200000 IJ SOLN
INTRAMUSCULAR | Status: AC
  Filled 2020-01-04: qty 60

## 2020-01-04 MED ORDER — PROMETHAZINE HCL 25 MG/ML IJ SOLN: 6.2500 mg | INTRAMUSCULAR | Status: DC | PRN

## 2020-01-04 SURGICAL SUPPLY — 73 items
APPLICATOR COTTON TIP 6 STRL (MISCELLANEOUS) IMPLANT
APPLICATOR COTTON TIP 6IN STRL (MISCELLANEOUS)
APPLIER CLIP 5 13 M/L LIGAMAX5 (MISCELLANEOUS)
BLADE SURG SZ11 CARB STEEL (BLADE) ×3 IMPLANT
CHLORAPREP W/TINT 26 (MISCELLANEOUS) ×3 IMPLANT
CLIP APPLIE 5 13 M/L LIGAMAX5 (MISCELLANEOUS) IMPLANT
COVER BACK TABLE 60X90IN (DRAPES) ×3 IMPLANT
COVER MAYO STAND STRL (DRAPES) ×3 IMPLANT
COVER SURGICAL LIGHT HANDLE (MISCELLANEOUS) ×3 IMPLANT
COVER TIP SHEARS 8 DVNC (MISCELLANEOUS) ×1 IMPLANT
COVER TIP SHEARS 8MM DA VINCI (MISCELLANEOUS) ×2
COVER WAND RF STERILE (DRAPES) ×3 IMPLANT
DEVICE TROCAR PUNCTURE CLOSURE (ENDOMECHANICALS) ×3 IMPLANT
DRAIN CHANNEL 19F RND (DRAIN) ×3 IMPLANT
DRAIN PENROSE 0.5X18 (DRAIN) IMPLANT
DRAPE ARM DVNC X/XI (DISPOSABLE) ×4 IMPLANT
DRAPE COLUMN DVNC XI (DISPOSABLE) ×1 IMPLANT
DRAPE CV SPLIT W-CLR ANES SCRN (DRAPES) ×3 IMPLANT
DRAPE DA VINCI XI ARM (DISPOSABLE) ×8
DRAPE DA VINCI XI COLUMN (DISPOSABLE) ×2
DRAPE PERI GROIN 82X75IN TIB (DRAPES) ×3 IMPLANT
DRAPE UTILITY XL STRL (DRAPES) ×6 IMPLANT
DRAPE WARM FLUID 44X44 (DRAPES) ×3 IMPLANT
DRSG TEGADERM 2-3/8X2-3/4 SM (GAUZE/BANDAGES/DRESSINGS) ×18 IMPLANT
ELECT REM PT RETURN 15FT ADLT (MISCELLANEOUS) ×3 IMPLANT
ENDOLOOP SUT PDS II  0 18 (SUTURE)
ENDOLOOP SUT PDS II 0 18 (SUTURE) IMPLANT
EVACUATOR SILICONE 100CC (DRAIN) ×3 IMPLANT
FELT TEFLON 4 X1 (Mesh General) ×3 IMPLANT
GAUZE SPONGE 2X2 8PLY STRL LF (GAUZE/BANDAGES/DRESSINGS) ×1 IMPLANT
GLOVE BIOGEL PI IND STRL 7.0 (GLOVE) ×1 IMPLANT
GLOVE BIOGEL PI IND STRL 7.5 (GLOVE) ×2 IMPLANT
GLOVE BIOGEL PI IND STRL 8 (GLOVE) ×2 IMPLANT
GLOVE BIOGEL PI INDICATOR 7.0 (GLOVE) ×2
GLOVE BIOGEL PI INDICATOR 7.5 (GLOVE) ×4
GLOVE BIOGEL PI INDICATOR 8 (GLOVE) ×4
GLOVE SURG SS PI 7.0 STRL IVOR (GLOVE) ×6 IMPLANT
GOWN STRL REUS W/TWL LRG LVL3 (GOWN DISPOSABLE) ×3 IMPLANT
GOWN STRL REUS W/TWL XL LVL3 (GOWN DISPOSABLE) ×6 IMPLANT
GRASPER SUT TROCAR 14GX15 (MISCELLANEOUS) ×3 IMPLANT
IRRIG SUCT STRYKERFLOW 2 WTIP (MISCELLANEOUS) ×3
IRRIGATION SUCT STRKRFLW 2 WTP (MISCELLANEOUS) ×1 IMPLANT
KIT BASIN OR (CUSTOM PROCEDURE TRAY) ×3 IMPLANT
KIT TURNOVER KIT A (KITS) IMPLANT
MESH PHASIX RESORB RECT 10X15 (Mesh General) ×3 IMPLANT
NEEDLE HYPO 22GX1.5 SAFETY (NEEDLE) ×3 IMPLANT
NEEDLE INSUFFLATION 14GA 120MM (NEEDLE) ×3 IMPLANT
NEEDLE SPNL 22GX3.5 QUINCKE BK (NEEDLE) ×3 IMPLANT
PAD POSITIONING PINK XL (MISCELLANEOUS) ×3 IMPLANT
PENCIL SMOKE EVACUATOR (MISCELLANEOUS) IMPLANT
SCISSORS LAP 5X45 EPIX DISP (ENDOMECHANICALS) IMPLANT
SEAL CANN UNIV 5-8 DVNC XI (MISCELLANEOUS) ×4 IMPLANT
SEAL XI 5MM-8MM UNIVERSAL (MISCELLANEOUS) ×8
SEALER VESSEL DA VINCI XI (MISCELLANEOUS) ×2
SEALER VESSEL EXT DVNC XI (MISCELLANEOUS) ×1 IMPLANT
SOLUTION ELECTROLUBE (MISCELLANEOUS) ×3 IMPLANT
SPONGE GAUZE 2X2 STER 10/PKG (GAUZE/BANDAGES/DRESSINGS) ×2
SPONGE LAP 18X18 RF (DISPOSABLE) ×3 IMPLANT
STOPCOCK 4 WAY LG BORE MALE ST (IV SETS) ×6 IMPLANT
SUT ETHIBOND 0 36 GRN (SUTURE) ×6 IMPLANT
SUT ETHIBOND NAB CT1 #1 30IN (SUTURE) ×9 IMPLANT
SUT MNCRL AB 4-0 PS2 18 (SUTURE) ×3 IMPLANT
SUT PDS AB 1 CT1 27 (SUTURE) ×3 IMPLANT
SUT PROLENE 2 0 SH DA (SUTURE) ×3 IMPLANT
SUT VICRYL 0 TIES 12 18 (SUTURE) IMPLANT
SUT VICRYL 0 UR6 27IN ABS (SUTURE) ×3 IMPLANT
SUT VLOC 180 2-0 9IN GS21 (SUTURE) ×3 IMPLANT
SYR 10ML LL (SYRINGE) ×3 IMPLANT
SYR 20ML LL LF (SYRINGE) ×3 IMPLANT
TOWEL OR 17X26 10 PK STRL BLUE (TOWEL DISPOSABLE) ×3 IMPLANT
TOWEL OR NON WOVEN STRL DISP B (DISPOSABLE) ×3 IMPLANT
TROCAR ADV FIXATION 5X100MM (TROCAR) ×3 IMPLANT
TUBING INSUFFLATION 10FT LAP (TUBING) ×3 IMPLANT

## 2020-01-04 NOTE — Anesthesia Procedure Notes (Signed)
Procedure Name: Intubation Date/Time: 01/04/2020 7:39 AM Performed by: Myna Bright, CRNA Pre-anesthesia Checklist: Patient identified, Emergency Drugs available, Suction available and Patient being monitored Patient Re-evaluated:Patient Re-evaluated prior to induction Oxygen Delivery Method: Circle system utilized Preoxygenation: Pre-oxygenation with 100% oxygen Induction Type: IV induction Ventilation: Mask ventilation without difficulty Laryngoscope Size: Mac and 3 Grade View: Grade I Tube type: Oral Tube size: 7.0 mm Number of attempts: 1 Airway Equipment and Method: Stylet Placement Confirmation: ETT inserted through vocal cords under direct vision,  breath sounds checked- equal and bilateral and positive ETCO2 Secured at: 21 cm Tube secured with: Tape Dental Injury: Teeth and Oropharynx as per pre-operative assessment

## 2020-01-04 NOTE — Interval H&P Note (Signed)
History and Physical Interval Note:  01/04/2020 6:32 AM  Lisa Galloway  has presented today for surgery, with the diagnosis of PARAESOPHAGEAL HIATAL HERNIA REFRACTORY TO MEDICAL MANAGEMENT.  The various methods of treatment have been discussed with the patient and family. After consideration of risks, benefits and other options for treatment, the patient has consented to  Procedure(s): ROBOTIC REPAIR OF PARAESOPHAGEAL HIATAL HERNIA WITH FUNDOPLICATION (N/A) as a surgical intervention.  The patient's history has been reviewed, patient examined, no change in status, stable for surgery.  I have reviewed the patient's chart and labs.  Questions were answered to the patient's satisfaction.    I have re-reviewed the the patient's records, history, medications, and allergies.  I have re-examined the patient.  I again discussed intraoperative plans and goals of post-operative recovery.  The patient agrees to proceed.  Lisa Galloway  March 30, 1961 [id number]  Patient Care Team: Ladell Pier, MD as PCP - General (Internal Medicine) Lorretta Harp, MD as PCP - Cardiology (Cardiology) Michael Boston, MD as Consulting Physician (General Surgery) Mauri Pole, MD as Consulting Physician (Gastroenterology) Lorretta Harp, MD as Consulting Physician (Cardiology)  Patient Active Problem List   Diagnosis Date Noted   Non-intractable vomiting    Regurgitation and rechewing    Cervical high risk human papillomavirus (HPV) DNA test positive 08/31/2019   Dermoid cyst 08/23/2019   Fibroids 08/23/2019   Atypical chest pain 06/09/2019   Iron deficiency anemia 10/16/2016   Large hiatal hernia 10/12/2016   Intermittent asthma 10/12/2016   GERD without esophagitis 10/08/2016   Prediabetes 10/08/2016   Obesity (BMI 30-39.9) 10/08/2016    Past Medical History:  Diagnosis Date   Allergy    HEY FEVER   Anemia    Anginal pain (HCC)    Arthritis    Asthma    Dyspnea    On exertion    GERD (gastroesophageal reflux disease)    Hayfever    Pre-diabetes     Past Surgical History:  Procedure Laterality Date   ESOPHAGEAL MANOMETRY N/A 10/04/2019   Procedure: ESOPHAGEAL MANOMETRY (EM);  Surgeon: Mauri Pole, MD;  Location: [facility];  Service: Endoscopy;  Laterality: N/A;   NO PAST SURGERIES      Social History   Socioeconomic History   Marital status: Married    Spouse name: Not on file   Number of children: 2   Years of education: Not on file   Highest education level: Not on file  Occupational History   Occupation: nurse  Tobacco Use   Smoking status: Never Smoker   Smokeless tobacco: Never Used  Vaping Use   Vaping Use: Never used  Substance and Sexual Activity   Alcohol use: No   Drug use: No   Sexual activity: Yes    Birth control/protection: None  Other Topics Concern   Not on file  Social History Narrative   Not on file   Social Determinants of Health   Financial Resource Strain:    Difficulty of Paying Living Expenses:   Food Insecurity:    Worried About Charity fundraiser in the Last Year:    Arboriculturist in the Last Year:   Transportation Needs:    Film/video editor (Medical):    Lack of Transportation (Non-Medical):   Physical Activity:    Days of Exercise per Week:    Minutes of Exercise per Session:   Stress:    Feeling of Stress :   Social  Connections:    Frequency of Communication with Friends and Family:    Frequency of Social Gatherings with Friends and Family:    Attends Religious Services:    Active Member of Clubs or Organizations:    Attends Music therapist:    Marital Status:   Intimate Partner Violence:    Fear of Current or Ex-Partner:    Emotionally Abused:    Physically Abused:    Sexually Abused:     Family History  Problem Relation Age of Onset   Kidney disease Mother    Diabetes Mother    Other Mother        cardiomegaly   Hypertension Sister    Diabetes Sister     Hypertension Brother    Diabetes Brother    Alcoholism Brother    Hypertension Sister    Diabetes Sister    Colon cancer Maternal Grandmother    Colon cancer Maternal Aunt    Sleep apnea Other     Medications Prior to Admission  Medication Sig Dispense Refill Last Dose   albuterol (VENTOLIN HFA) 108 (90 Base) MCG/ACT inhaler INHALE 2 PUFFS BY MOUTH EVERY 6 HOURS AS NEEDED FOR WHEEZING OR SHORTNESS OF BREATH (Patient taking differently: Inhale 2 puffs into the lungs every 6 (six) hours as needed for wheezing or shortness of breath. ) 18 g 2 Past Week at Unknown time   CRANBERRY PO Take 1 capsule by mouth daily.   Past Week at Unknown time   diclofenac Sodium (VOLTAREN) 1 % GEL Apply 2 g topically daily as needed (knee pain).   Past Week at Unknown time   famotidine (PEPCID) 40 MG tablet Take 1 tablet (40 mg total) by mouth 2 (two) times daily. 60 tablet 5 Past Week at Unknown time   ferrous sulfate 325 (65 FE) MG tablet Take 325 mg by mouth daily with breakfast.   Past Week at Unknown time   metFORMIN (GLUCOPHAGE) 500 MG tablet Take 1 tablet (500 mg total) by mouth daily with breakfast. 30 tablet 6 Past Week at Unknown time   Multiple Vitamin (MULTIVITAMIN WITH MINERALS) TABS tablet Take 1 tablet by mouth daily.   Past Week at Unknown time   omeprazole (PRILOSEC) 40 MG capsule Take 1 capsule (40 mg total) by mouth 2 (two) times daily. 60 capsule 5 Past Week at Unknown time   IRON PO Take 1 tablet by mouth daily.  (Patient not taking: Reported on 12/22/2019)   Not Taking at Unknown time   naproxen (NAPROSYN) 125 MG/5ML suspension Take by mouth 2 (two) times daily with a meal. (Patient not taking: Reported on 12/22/2019)   Not Taking at Unknown time    Current Facility-Administered Medications  Medication Dose Route Frequency Provider Last Rate Last Admin   bupivacaine liposome (EXPAREL) 1.3 % injection 266 mg  20 mL Infiltration On Call to OR Wofford, Drew A, RPH       cefTRIAXone (ROCEPHIN) 2  g in sodium chloride 0.9 % 100 mL IVPB  2 g Intravenous On Call to OR Michael Boston, MD       And   metroNIDAZOLE (FLAGYL) IVPB 500 mg  500 mg Intravenous On Call to OR Michael Boston, MD       dexamethasone (DECADRON) injection 4 mg  4 mg Intravenous On Call to OR Michael Boston, MD       feeding supplement (ENSURE PRE-SURGERY) liquid 296 mL  296 mL Oral Once Michael Boston, MD  feeding supplement (ENSURE PRE-SURGERY) liquid 592 mL  592 mL Oral Once Michael Boston, MD       lactated ringers infusion   Intravenous Continuous Josephine Igo, MD 10 mL/hr at 01/04/20 0607 New Bag at 01/04/20 0607   scopolamine (TRANSDERM-SCOP) 1 MG/3DAYS 1.5 mg  1 patch Transdermal On Call to OR Michael Boston, MD   1.5 mg at 01/04/20 0609     Allergies  Allergen Reactions   Ginger Hives    Possible reaction to ginger root   Latex Itching   Oxycodone Nausea And Vomiting and Other (See Comments)    Heart racing, flushed, "felt like a heart attack"    There were no vitals taken for this visit.  Labs: No results found for this or any previous visit (from the past 48 hour(s)).  Imaging / Studies: No results found.   Adin Hector, M.D., F.A.C.S. Gastrointestinal and Minimally Invasive Surgery [facility] [address] [place], [place] [address] [phone] Main / Paging  01/04/2020 6:32 AM    Adin Hector

## 2020-01-04 NOTE — Anesthesia Postprocedure Evaluation (Signed)
Anesthesia Post Note  Patient: ELLIETT GUARISCO  Procedure(s) Performed: ROBOTIC REPAIR OF PARAESOPHAGEAL HIATAL HERNIA WITH FUNDOPLICATION WITH MESH, BILATERAL TAP BLOCK (N/A Abdomen)     Patient location during evaluation: PACU Anesthesia Type: General Level of consciousness: awake and alert Pain management: pain level controlled Vital Signs Assessment: post-procedure vital signs reviewed and stable Respiratory status: spontaneous breathing, nonlabored ventilation, respiratory function stable and patient connected to nasal cannula oxygen Cardiovascular status: blood pressure returned to baseline and stable Postop Assessment: no apparent nausea or vomiting Anesthetic complications: no   No complications documented.  Last Vitals:  Vitals:   01/04/20 1245 01/04/20 1318  BP: (!) 143/86 (!) 162/105  Pulse: 79 83  Resp: 13 16  Temp:  (!) 36.4 C  SpO2: 95% 100%    Last Pain:  Vitals:   01/04/20 1318  TempSrc: Oral  PainSc:                  Audry Pili

## 2020-01-04 NOTE — Op Note (Addendum)
01/04/2020  11:08 AM  PATIENT:  Lisa Galloway  59 y.o. female  Patient Care Team: Ladell Pier, MD as PCP - General (Internal Medicine) Lorretta Harp, MD as PCP - Cardiology (Cardiology) Michael Boston, MD as Consulting Physician (General Surgery) Mauri Pole, MD as Consulting Physician (Gastroenterology) Lorretta Harp, MD as Consulting Physician (Cardiology)  PRE-OPERATIVE DIAGNOSIS:  PARAESOPHAGEAL HIATAL HERNIA REFRACTORY TO MEDICAL MANAGEMENT  POST-OPERATIVE DIAGNOSIS:  PARAESOPHAGEAL HIATAL HERNIA REFRACTORY TO MEDICAL MANAGEMENT  PROCEDURE:    1. ROBOTIC reduction of paraesophageal hiatal hernia 2. Type II mediastinal dissection. 3. Primary repair of hiatal hernia over pledgets.  4. Anterior & posterior gastropexy. 5. Nissen fundoplication 2 cm over a 56-French bougie 6. Mesh reinforcement with absorbable mesh  SURGEON:  Adin Hector, MD  ASSIST:  Gurney Maxin, MD An experienced assistant was required given the standard of surgical care given the complexity of the case.  This assistant was needed for exposure, dissection, suctioning, retraction, instrument exchange, etc.  ANESTHESIA:   local and general   Nerve block provided with liposomal bupivacaine (Experel) mixed with 0.25% bupivacaine as a Bilateral TAP block x 59mL each side at the level of the anterior superior iliac spine of flanks under laparoscopic guidance    EBL:  Total I/O In: 1750 [I.V.:1750] Out: 72 [Blood:50]  Delay start of Pharmacological VTE agent (>24hrs) due to surgical blood loss or risk of bleeding:  no  ANESTHESIA: 1. General anesthesia. 2. Local anesthetic in a field block around all port sites.  SPECIMEN:  Mediastinal hernia sac (not sent).  DRAINS:  A 19-French Blake drain goes from the right upper quadrant along the lesser curvature of the stomach into the mediastinum.  COUNTS:  YES  PLAN OF CARE: Admit to inpatient   PATIENT DISPOSITION:  PACU -  hemodynamically stable.  INDICATION:   Patient with symptomatic paraesophageal hiatal hernia.  The patient has had extensive work-up & we feel the patient will benefit from repair:  The anatomy & physiology of the foregut and anti-reflux mechanism was discussed.  The pathophysiology of hiatal herniation and GERD was discussed.  Natural history risks without surgery was discussed.   The patient's symptoms are not adequately controlled by medicines and other non-operative treatments.  I feel the risks of no intervention will lead to serious problems that outweigh the operative risks; therefore, I recommended surgery to reduce the hiatal hernia out of the chest and fundoplication to rebuild the anti-reflux valve and control reflux better.  Need for a thorough workup to rule out the differential diagnosis and plan treatment was explained.  I explained laparoscopic techniques with possible need for an open approach.  Risks such as bleeding, infection, abscess, leak, need for further treatment, heart attack, death, and other risks were discussed.   I noted a good likelihood this will help address the problem.  Goals of post-operative recovery were discussed as well.  Possibility that this will not correct all symptoms was explained.  Post-operative dysphagia, need for short-term liquid & pureed diet, inability to vomit, possibility of reherniation, possible need for medicines to help control symptoms in addition to surgery were discussed.  We will work to minimize complications.   Educational handouts further explaining the pathology, treatment options, and dysphagia diet was given as well.  Questions were answered.  The patient expresses understanding & wishes to proceed with surgery.  OR FINDINGS:   Moderate-sized paraesophageal hiatal hernia with 50% of the stomach in the mediastinum.  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  No ischemia.  There was a 8 x 6 cm hiatal  defect.  It is a primary repair over pledgets. Mesh reinforcement was used with Mesh was used: PhasixT Mesh (a knitted monofilament mesh scaffold using Poly-4-hydroxybutyrate (P4HB), a biologically derived, fully resorbable material)  The patient has a 2 cm Nissen fundoplication that was done over 56-French bougie.  The patient has posterior gastropexy.  DESCRIPTION:   Informed consent was confirmed.  The patient received IV antibiotics prior to incision.  The underwent general anesthesia without difficulty.  A Foley catheter sterilely placed.  The patient was positioned in split leg with arms tucked. The abdomen was prepped and draped in the sterile fashion.  Surgical time-out confirmed our plan.  I placed a 5 mm port in the left subcostal region using Varess entry technique with the patient in steep reverse Trendelenburg and left side up.  Entry was clean.  We induced carbon dioxide insufflation.  Camera inspection revealed no injury.  Under direct visualization, I placed extra ports.  I also placed a 5 mm port in the left subxiphoid region under direct visualization.  I removed that and placed an Omega-shaped rigid Nathanson liver retractor to lift the left lateral sector of the liver anteriorly to expose the esophageal hiatus.  This was secured to the bed using the iron man system.  The Xi robot was carefully docked and instruments placed and advanced under direct visualization.  We focused on dissection.  Could see the stomach was twisted going up into the mediastinum with an obvious hiatal hernia defect.  Consistent with an organoaxial at least partial volvulus station.  No ischemia.  We grasped the anterior mediastinal sac at the apex of the crus.  I scored through that and got into the anterior mediastinum.  I was able to free the mediastinal sac from its attachments to the pericardium and bilateral pleura using primarily focused gentle blunt dissection as well as focused vessel sealer  dissection.  I transected phrenoesophageal attachments to the inner right crus, preserving a two centimeter cuff of mediastinal sac until I found the base of the crura.  I then came around anteriorly on the left side and freed up the phrenoesophageal attachments of the mediastinal sac on the medial part of the left crus on the superior half.  I did careful mediastinal dissection to free the mediastinal sac.  With that, we could relieve the suction cup affect of the hernia sac and help reduce the stomach back down into the abdomen, flipped back approriately.    We ligated the short gastrics along the lesser curvature of the stomach about a third the way down and then came up proximally over the fundus.  We released the attachments of the stomach to the retroperitoneum until we were able to connect with the prior dissection on the left crus.  We completed the release of phrenoesophageal attachments to the medial part of the left crus down to its base.  With this, we had circumferential mobilization.    We placed the stomach and esophagus on axial tension.  I then did a Type II mediastinal dissection where I freed the esophagus from its attachments to the aorta, spine, pleura, and pericardium using primarily gentle blunt as well as focused ultrasonic dissection.  We saw the anterior & posterior vagus nerves intact.  We preserved it at all times.  I procedded to dissect about 20 cm proximally into the mediastinum.  With that  I could straighten out the esophagus and get 5 cm of intra-abdominal length of the esophagus at a best estimation.  I freed the anterior mediastinal sac off the esophagus & stomach.  We saw the anterior vagus nerve and freed the sac off of the vagus.  I dissected out & removed the fatty  epiphrenic pads at the esophagogastric junction. With that, I could better define the esophagogastric junction.  I confirmed the the patient had 4 cm of intra-abdominal esophageal length off tension.  I freed  the right crus of adhesions to the caudate lobe and the liver.  This help relax the right crus more.  Mobilized the posterior left crus off the retroperitoneum for little more mobility as well.  Patient had a very large left lateral sector of liver work to free that off of the diaphragm anterior to the carina.  I brought the fundus of the stomach posterior to the esophagus over to the right side.  The wrap was mobile with the classic shoe shine maneuver.  Wrap became together gently.  Reduced intra-abdominal insufflation pressure down to 10.  We reflected the stomach left laterally and closed the esophageal hiatus using 0 Ethibond stitch using horizontal mattress stitches with pledgets on both sides.  I did that x2 stitches.  I did a third horizontal mattress suture with Ethibond suture only avoiding pledgets at the most anterior part of the closure.  The crura had good substance and they came together well without any tension.  Of note when passing free pledgets through the port the port had slid into the abdominal wall and a pledget count was off.  Because of the larger defect in a relatively young obese woman, I reinforced the repair using a 15 x 10 cm biologic Phasix mesh.  I cut out a 2x4 cm part of mesh in the middle third of the mesh such that the mesh had a broad U shape transversely, one tail 5 cm wide & the other 8cm.  We brought the mesh in and laid it over the crural repair, tails anterior over the crura.  I tucked the broader "U" tail of the mesh between the left diaphragm and the spleen, the narrower "U" tail over the right crus.  I then secured the posterior & anterior corners of the narrower"U" tail with 0 Ethibond upper interrupted suture to the right crus.  I secured to the left lateral and left superior sides of the broader "U" tail to the left diaphragm band with 2-0 V lock running suture.   I brought the fundus of the stomach behind the esophagus and cardia to set up a fundoplication wrap.   I did a posterior gastropexy by taking of #1 Ethibond stitches to the posterior part of the right side of the wrap and thru the Phasix mesh and crural closure.  I did not times 3 interrupted sutures for good posterior gastropexy.  In this manner, all pledgets were covered by Phasix mesh.  Phasix mesh was covered by stomach so no esophagus was exposed to Phasix mesh nor pledgets.   With the posterior gastropexy's, stomach laid well for a fundoplication wrap.   Next, Anesthesia passed a 56-French blunt tipped Hurst bougie transorally. We did do this under esophagogastric axial tension.  It passed down easily without resistance.    I then did a classic 2 cm Nissen fundoplication on the true esophagus above the cardia using Ethibond stitch in the left side of the wrap, then anterior esophagus, and then  right side of the wrap and tied that down. Did 3 stitches.  I measured it and it was 2 cm in length.  We removed the bougie.  It was intact.    The wrap was soft and floppy.  Removed all suture pledgets and hernia sacs.  Needle counts were correct.  There was concern that pledget count was off.  Removed the port in the left upper chrome port site where instruments been passed.  We dissect through the subcutaneous tissue and the fascia and located a solitary pledget in the subfascial preperitoneal plane.  We did this while we were looking in laparoscopically and saw no pledget follow out for any other abnormality.  Looking at pledget counts they were even now.  I did laparoscopic reexploration the entire abdominal cavity and saw no pledget, suture, stitch, abnormal tissue.  Hemostasis was good.  I placed a drain as noted above.  I did irrigation and ensured hemostasis.  No evidence of any leak or perforation or other abnormality.  I removed the [facility] liver retractor under direct visualization.  I closed the fascial defect in the left upper quadrant region that had been opened up to get the missing pledget.  Did  that with a number PDS using laparoscopic suture passer under good laparoscopic guidance.  Did extra blockade at that location.  I evacuated carbon dioxide and removed the ports.  The skin was closed with Monocryl and sterile dressings applied.  The patient is being extubated and brought back to the recovery room.  I discussed postop care in detail with the patient and family in in the office.  Discussed again with the patient in the holding area. I made an attempt to locate family to discuss patient's status and recommendations.  Left a voicemail on the husband's telephone.  No one is available at this time.  I will try again later  Adin Hector, M.D., F.A.C.S. Gastrointestinal and Minimally Invasive Surgery [facility] [address], [place], [place] [address] [phone] Main / Paging

## 2020-01-04 NOTE — Transfer of Care (Signed)
Immediate Anesthesia Transfer of Care Note  Patient: Lisa Galloway  Procedure(s) Performed: ROBOTIC REPAIR OF PARAESOPHAGEAL HIATAL HERNIA WITH FUNDOPLICATION WITH MESH, BILATERAL TAP BLOCK (N/A Abdomen)  Patient Location: PACU  Anesthesia Type:General  Level of Consciousness: awake, alert , oriented and patient cooperative  Airway & Oxygen Therapy: Patient Spontanous Breathing and Patient connected to face mask oxygen  Post-op Assessment: Report given to RN, Post -op Vital signs reviewed and stable and Patient moving all extremities  Post vital signs: Reviewed and stable  Last Vitals:  Vitals Value Taken Time  BP    Temp    Pulse 80 01/04/20 1120  Resp 14 01/04/20 1120  SpO2 100 % 01/04/20 1120  Vitals shown include unvalidated device data.  Last Pain:  Vitals:   01/04/20 0110  TempSrc: Oral         Complications: No complications documented.

## 2020-01-04 NOTE — Discharge Instructions (Signed)
EATING AFTER YOUR ESOPHAGEAL SURGERY (Stomach Fundoplication, Hiatal Hernia repair, Achalasia surgery, etc)  ######################################################################  EAT Start with a pureed / full liquid diet (see below) Gradually transition to a high fiber diet with a fiber supplement over the next month after discharge.    WALK Walk an hour a day.  Control your pain to do that.    CONTROL PAIN Control pain so that you can walk, sleep, tolerate sneezing/coughing, go up/down stairs.  HAVE A BOWEL MOVEMENT DAILY Keep your bowels regular to avoid problems.  OK to try a laxative to override constipation.  OK to use an antidairrheal to slow down diarrhea.  Call if not better after 2 tries  CALL IF YOU HAVE PROBLEMS/CONCERNS Call if you are still struggling despite following these instructions. Call if you have concerns not answered by these instructions  ######################################################################   After your esophageal surgery, expect some sticking with swallowing over the next 1-2 months.    If food sticks when you eat, it is called "dysphagia".  This is due to swelling around your esophagus at the wrap & hiatal diaphragm repair.  It will gradually ease off over the next few months.  To help you through this temporary phase, we start you out on a pureed (blenderized) diet.  Your first meal in the hospital was thin liquids.  You should have been given a pureed diet by the time you left the hospital.  We ask patients to stay on a pureed diet for the first 2-3 weeks to avoid anything getting "stuck" near your recent surgery.  Don't be alarmed if your ability to swallow doesn't progress according to this plan.  Everyone is different and some diets can advance more or less quickly.    It is often helpful to crush your medications or split them as they can sometimes stick, especially the first week or so.   Some BASIC RULES to follow  are:  Maintain an upright position whenever eating or drinking.  Take small bites - just a teaspoon size bite at a time.  Eat slowly.  It may also help to eat only one food at a time.  Consider nibbling through smaller, more frequent meals & avoid the urge to eat BIG meals  Do not push through feelings of fullness, nausea, or bloatedness  Do not mix solid foods and liquids in the same mouthful  Try not to "wash foods down" with large gulps of liquids.  Avoid carbonated (bubbly/fizzy) drinks.    Avoid foods that make you feel gassy or bloated.  Start with bland foods first.  Wait on trying greasy, fried, or spicy meals until you are tolerating more bland solids well.  Understand that it will be hard to burp and belch at first.  This gradually improves with time.  Expect to be more gassy/flatulent/bloated initially.  Walking will help your body manage it better.  Consider using medications for bloating that contain simethicone such as  Maalox or Gas-X   Consider crushing her medications, especially smaller pills.  The ability to swallow pills should get easier after a few weeks  Eat in a relaxed atmosphere & minimize distractions.  Avoid talking while eating.    Do not use straws.  Following each meal, sit in an upright position (90 degree angle) for 60 to 90 minutes.  Going for a short walk can help as well  If food does stick, don't panic.  Try to relax and let the food pass on its own.    Sipping WARM LIQUID such as strong hot black tea can also help slide it down.   Be gradual in changes & use common sense:  -If you easily tolerating a certain "level" of foods, advance to the next level gradually -If you are having trouble swallowing a particular food, then avoid it.   -If food is sticking when you advance your diet, go back to thinner previous diet (the lower LEVEL) for 1-2 days.  LEVEL 1 = PUREED DIET  Do for the first 2 WEEKS AFTER SURGERY  -Foods in this group are  pureed or blenderized to a smooth, mashed potato-like consistency.  -If necessary, the pureed foods can keep their shape with the addition of a thickening agent.   -Meat should be pureed to a smooth, pasty consistency.  Hot broth or gravy may be added to the pureed meat, approximately 1 oz. of liquid per 3 oz. serving of meat. -CAUTION:  If any foods do not puree into a smooth consistency, swallowing will be more difficult.  (For example, nuts or seeds sometimes do not blend well.)  Hot Foods Cold Foods  Pureed scrambled eggs and cheese Pureed cottage cheese  Baby cereals Thickened juices and nectars  Thinned cooked cereals (no lumps) Thickened milk or eggnog  Pureed French toast or pancakes Ensure  Mashed potatoes Ice cream  Pureed parsley, au gratin, scalloped potatoes, candied sweet potatoes Fruit or Italian ice, sherbet  Pureed buttered or alfredo noodles Plain yogurt  Pureed vegetables (no corn or peas) Instant breakfast  Pureed soups and creamed soups Smooth pudding, mousse, custard  Pureed scalloped apples Whipped gelatin  Gravies Sugar, syrup, honey, jelly  Sauces, cheese, tomato, barbecue, white, creamed Cream  Any baby food Creamer  Alcohol in moderation (not beer or champagne) Margarine  Coffee or tea Mayonnaise   Ketchup, mustard   Apple sauce   SAMPLE MENU:  PUREED DIET Breakfast Lunch Dinner   Orange juice, 1/2 cup  Cream of wheat, 1/2 cup  Pineapple juice, 1/2 cup  Pureed turkey, barley soup, 3/4 cup  Pureed Hawaiian chicken, 3 oz   Scrambled eggs, mashed or blended with cheese, 1/2 cup  Tea or coffee, 1 cup   Whole milk, 1 cup   Non-dairy creamer, 2 Tbsp.  Mashed potatoes, 1/2 cup  Pureed cooled broccoli, 1/2 cup  Apple sauce, 1/2 cup  Coffee or tea  Mashed potatoes, 1/2 cup  Pureed spinach, 1/2 cup  Frozen yogurt, 1/2 cup  Tea or coffee      LEVEL 2 = SOFT DIET  After your first 2 weeks, you can advance to a soft diet.   Keep on this  diet until everything goes down easily.  Hot Foods Cold Foods  White fish Cottage cheese  Stuffed fish Junior baby fruit  Baby food meals Semi thickened juices  Minced soft cooked, scrambled, poached eggs nectars  Souffle & omelets Ripe mashed bananas  Cooked cereals Canned fruit, pineapple sauce, milk  potatoes Milkshake  Buttered or Alfredo noodles Custard  Cooked cooled vegetable Puddings, including tapioca  Sherbet Yogurt  Vegetable soup or alphabet soup Fruit ice, Italian ice  Gravies Whipped gelatin  Sugar, syrup, honey, jelly Junior baby desserts  Sauces:  Cheese, creamed, barbecue, tomato, white Cream  Coffee or tea Margarine   SAMPLE MENU:  LEVEL 2 Breakfast Lunch Dinner   Orange juice, 1/2 cup  Oatmeal, 1/2 cup  Scrambled eggs with cheese, 1/2 cup  Decaffeinated tea, 1 cup  Whole milk, 1 cup    Non-dairy creamer, 2 Tbsp  Pineapple juice, 1/2 cup  Minced beef, 3 oz  Gravy, 2 Tbsp  Mashed potatoes, 1/2 cup  Minced fresh broccoli, 1/2 cup  Applesauce, 1/2 cup  Coffee, 1 cup  Turkey, barley soup, 3/4 cup  Minced Hawaiian chicken, 3 oz  Mashed potatoes, 1/2 cup  Cooked spinach, 1/2 cup  Frozen yogurt, 1/2 cup  Non-dairy creamer, 2 Tbsp      LEVEL 3 = CHOPPED DIET  -After all the foods in level 2 (soft diet) are passing through well you should advance up to more chopped foods.  -It is still important to cut these foods into small pieces and eat slowly.  Hot Foods Cold Foods  Poultry Cottage cheese  Chopped Swedish meatballs Yogurt  Meat salads (ground or flaked meat) Milk  Flaked fish (tuna) Milkshakes  Poached or scrambled eggs Soft, cold, dry cereal  Souffles and omelets Fruit juices or nectars  Cooked cereals Chopped canned fruit  Chopped French toast or pancakes Canned fruit cocktail  Noodles or pasta (no rice) Pudding, mousse, custard  Cooked vegetables (no frozen peas, corn, or mixed vegetables) Green salad  Canned small sweet peas  Ice cream  Creamed soup or vegetable soup Fruit ice, Italian ice  Pureed vegetable soup or alphabet soup Non-dairy creamer  Ground scalloped apples Margarine  Gravies Mayonnaise  Sauces:  Cheese, creamed, barbecue, tomato, white Ketchup  Coffee or tea Mustard   SAMPLE MENU:  LEVEL 3 Breakfast Lunch Dinner   Orange juice, 1/2 cup  Oatmeal, 1/2 cup  Scrambled eggs with cheese, 1/2 cup  Decaffeinated tea, 1 cup  Whole milk, 1 cup  Non-dairy creamer, 2 Tbsp  Ketchup, 1 Tbsp  Margarine, 1 tsp  Salt, 1/4 tsp  Sugar, 2 tsp  Pineapple juice, 1/2 cup  Ground beef, 3 oz  Gravy, 2 Tbsp  Mashed potatoes, 1/2 cup  Cooked spinach, 1/2 cup  Applesauce, 1/2 cup  Decaffeinated coffee  Whole milk  Non-dairy creamer, 2 Tbsp  Margarine, 1 tsp  Salt, 1/4 tsp  Pureed turkey, barley soup, 3/4 cup  Barbecue chicken, 3 oz  Mashed potatoes, 1/2 cup  Ground fresh broccoli, 1/2 cup  Frozen yogurt, 1/2 cup  Decaffeinated tea, 1 cup  Non-dairy creamer, 2 Tbsp  Margarine, 1 tsp  Salt, 1/4 tsp  Sugar, 1 tsp    LEVEL 4:  REGULAR FOODS  -Foods in this group are soft, moist, regularly textured foods.   -This level includes meat and breads, which tend to be the hardest things to swallow.   -Eat very slowly, chew well and continue to avoid carbonated drinks. -most people are at this level in 4-6 weeks  Hot Foods Cold Foods  Baked fish or skinned Soft cheeses - cottage cheese  Souffles and omelets Cream cheese  Eggs Yogurt  Stuffed shells Milk  Spaghetti with meat sauce Milkshakes  Cooked cereal Cold dry cereals (no nuts, dried fruit, coconut)  French toast or pancakes Crackers  Buttered toast Fruit juices or nectars  Noodles or pasta (no rice) Canned fruit  Potatoes (all types) Ripe bananas  Soft, cooked vegetables (no corn, lima, or baked beans) Peeled, ripe, fresh fruit  Creamed soups or vegetable soup Cakes (no nuts, dried fruit, coconut)  Canned chicken  noodle soup Plain doughnuts  Gravies Ice cream  Bacon dressing Pudding, mousse, custard  Sauces:  Cheese, creamed, barbecue, tomato, white Fruit ice, Italian ice, sherbet  Decaffeinated tea or coffee Whipped gelatin  Pork chops Regular gelatin     Canned fruited gelatin molds   Sugar, syrup, honey, jam, jelly   Cream   Non-dairy   Margarine   Oil   Mayonnaise   Ketchup   Mustard   TROUBLESHOOTING IRREGULAR BOWELS  1) Avoid extremes of bowel movements (no bad constipation/diarrhea)  2) Miralax 17gm mixed in 8oz. water or juice-daily. May use BID as needed.  3) Gas-x,Phazyme, etc. as needed for gas & bloating.  4) Soft,bland diet. No spicy,greasy,fried foods.  5) Prilosec over-the-counter as needed  6) May hold gluten/wheat products from diet to see if symptoms improve.  7) May try probiotics (Align, Activa, etc) to help calm the bowels down  7) If symptoms become worse call back immediately.    If you have any questions please call our office at [place]: [phone].   LAPAROSCOPIC SURGERY: POST OP INSTRUCTIONS  ######################################################################  EAT Gradually transition to a high fiber diet with a fiber supplement over the next few weeks after discharge.  Start with a pureed / full liquid diet (see below)  WALK Walk an hour a day.  Control your pain to do that.    CONTROL PAIN Control pain so that you can walk, sleep, tolerate sneezing/coughing, go up/down stairs.  HAVE A BOWEL MOVEMENT DAILY Keep your bowels regular to avoid problems.  OK to try a laxative to override constipation.  OK to use an antidairrheal to slow down diarrhea.  Call if not better after 2 tries  CALL IF YOU HAVE PROBLEMS/CONCERNS Call if you are still struggling despite following these instructions. Call if you have concerns not answered by these  instructions  ######################################################################    1. DIET: Follow a light bland diet & liquids the first 24 hours after arrival home, such as soup, liquids, starches, etc.  Be sure to drink plenty of fluids.  Quickly advance to a usual solid diet within a few days.  Avoid fast food or heavy meals as your are more likely to get nauseated or have irregular bowels.  A low-fat, high-fiber diet for the rest of your life is ideal.  2. Take your usually prescribed home medications unless otherwise directed.  3. PAIN CONTROL: a. Pain is best controlled by a usual combination of three different methods TOGETHER: i. Ice/Heat ii. Over the counter pain medication iii. Prescription pain medication b. Most patients will experience some swelling and bruising around the incisions.  Ice packs or heating pads (30-60 minutes up to 6 times a day) will help. Use ice for the first few days to help decrease swelling and bruising, then switch to heat to help relax tight/sore spots and speed recovery.  Some people prefer to use ice alone, heat alone, alternating between ice & heat.  Experiment to what works for you.  Swelling and bruising can take several weeks to resolve.   c. It is helpful to take an over-the-counter pain medication regularly for the first few weeks.  Choose one of the following that works best for you: i. Naproxen (Aleve, etc)  Two 220mg  tabs twice a day ii. Ibuprofen (Advil, etc) Three 200mg  tabs four times a day (every meal & bedtime) iii. Acetaminophen (Tylenol, etc) 500-650mg  four times a day (every meal & bedtime) d. A  prescription for pain medication (such as oxycodone, hydrocodone, tramadol, gabapentin, methocarbamol, etc) should be given to you upon discharge.  Take your pain medication as prescribed.  i. If you are having problems/concerns with the prescription medicine (does not control pain, nausea, vomiting, rash, itching, etc), please  call us [phone]  [phone] to see if we need to switch you to a different pain medicine that will work better for you and/or control your side effect better. ii. If you need a refill on your pain medication, please give [place] 48 hour notice.  contact your pharmacy.  They will contact our office to request authorization. Prescriptions will not be filled after 5 pm or on week-ends  4. Avoid getting constipated.   a. Between the surgery and the pain medications, it is common to experience some constipation.   b. Increasing fluid intake and taking a fiber supplement (such as Metamucil, Citrucel, FiberCon, MiraLax, etc) 1-2 times a day regularly will usually help prevent this problem from occurring.   c. A mild laxative (prune juice, Milk of Magnesia, MiraLax, etc) should be taken according to package directions if there are no bowel movements after 48 hours.   5. Watch out for diarrhea.   a. If you have many loose bowel movements, simplify your diet to bland foods & liquids for a few days.   b. Stop any stool softeners and decrease your fiber supplement.   c. Switching to mild anti-diarrheal medications (Kayopectate, Pepto Bismol) can help.   d. If this worsens or does not improve, please call us.  6. Wash / shower every day.  You may shower over the dressings as they are waterproof.  Continue to shower over incision(s) after the dressing is off.  7. Remove your waterproof bandages 5 days after surgery.  You may leave the incision open to air.  You may replace a dressing/Band-Aid to cover the incision for comfort if you wish.   8. ACTIVITIES as tolerated:   a. You may resume regular (light) daily activities beginning the next day--such as daily self-care, walking, climbing stairs--gradually increasing activities as tolerated.  If you can walk 30 minutes without difficulty, it is safe to try more intense activity such as jogging, treadmill, bicycling, low-impact aerobics, swimming, etc. b. Save the most intensive and  strenuous activity for last such as sit-ups, heavy lifting, contact sports, etc  Refrain from any heavy lifting or straining until you are off narcotics for pain control.   c. DO NOT PUSH THROUGH PAIN.  Let pain be your guide: If it hurts to do something, don't do it.  Pain is your body warning you to avoid that activity for another week until the pain goes down. d. You may drive when you are no longer taking prescription pain medication, you can comfortably wear a seatbelt, and you can safely maneuver your car and apply brakes. e. Dennis Bast may have sexual intercourse when it is comfortable.  9. FOLLOW UP in our office a. Please call [facility] at [phone] to set up an appointment to see your surgeon in the office for a follow-up appointment approximately 2-3 weeks after your surgery. b. Make sure that you call for this appointment the day you arrive home to insure a convenient appointment time.  10. IF YOU HAVE DISABILITY OR FAMILY LEAVE FORMS, BRING THEM TO THE OFFICE FOR PROCESSING.  DO NOT GIVE THEM TO YOUR DOCTOR.   WHEN TO CALL us [phone]: 1. Poor pain control 2. Reactions / problems with new medications (rash/itching, nausea, etc)  3. Fever over 101.5 F (38.5 C) 4. Inability to urinate 5. Nausea and/or vomiting 6. Worsening swelling or bruising 7. Continued bleeding from incision. 8. Increased pain, redness, or drainage from the incision   The clinic staff is available to  answer your questions during regular business hours (8:30am-5pm).  Please don't hesitate to call and ask to speak to one of our nurses for clinical concerns.   If you have a medical emergency, go to the nearest emergency room or call 911.  A surgeon from [facility] is always on call at the [facility], [place], [place], [place], [place]  [id number] ? MAIN: [phone] ? TOLL FREE: [phone] ?  FAX [phone] [email or web address]

## 2020-01-05 ENCOUNTER — Inpatient Hospital Stay (HOSPITAL_COMMUNITY)

## 2020-01-05 ENCOUNTER — Other Ambulatory Visit: Payer: Self-pay

## 2020-01-05 ENCOUNTER — Encounter (HOSPITAL_COMMUNITY): Payer: Self-pay | Admitting: Surgery

## 2020-01-05 MED ORDER — FAMOTIDINE 20 MG PO TABS
20.0000 mg | ORAL_TABLET | Freq: Two times a day (BID) | ORAL | Status: DC
  Administered 2020-01-05 – 2020-01-06 (×2): 20 mg via ORAL
  Filled 2020-01-05 (×2): qty 1

## 2020-01-05 NOTE — Discharge Summary (Signed)
Physician Discharge Summary    Patient ID: Lisa Galloway MRN: [id number] DOB/AGE: December 18, 1960  59 y.o.  Patient Care Team: Ladell Pier, MD as PCP - General (Internal Medicine) Lorretta Harp, MD as PCP - Cardiology (Cardiology) Michael Boston, MD as Consulting Physician (General Surgery) Mauri Pole, MD as Consulting Physician (Gastroenterology) Lorretta Harp, MD as Consulting Physician (Cardiology)  Admit date: 01/04/2020  Discharge date: 01/05/2020  Hospital Stay = 1 days    Discharge Diagnoses:  Principal Problem:   Incarcerated hiatal hernia s/p robotic repair 01/04/2020 Active Problems:   GERD without esophagitis   Prediabetes   Obesity (BMI 30-39.9)   Intermittent asthma   Atypical chest pain   Fibroids   Non-intractable vomiting   Regurgitation and rechewing   1 Day Post-Op  01/04/2020  POST-OPERATIVE DIAGNOSIS: PARAESOPHAGEAL HIATAL HERNIA REFRACTORY TO MEDICAL MANAGEMENT  PROCEDURE:   1. ROBOTIC reduction of paraesophageal hiatal hernia 2. Type II mediastinal dissection. 3. Primary repair of hiatal hernia over pledgets.  4. Anterior & posterior gastropexy. 5. Nissen fundoplication 2 cm over a 56-French bougie 6. 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 No ischemia. There was a8x 6cm hiatal defect.  It is a primary repair over pledgets. Mesh reinforcement was used withMesh was used: PhasixTMesh (a knitted monofilament mesh scaffold using Poly-4-hydroxybutyrate (P4HB), a biologically derived, fully resorbable material)  The patient has a 2 cm Nissen fundoplication that was done over 56-French bougie. The patient has posterior gastropexy.  Consults: nutrition  Hospital Course:   The patient underwent the  surgery above.  Postoperatively, the patient gradually mobilized and advanced to a solid diet.  Pain and other symptoms were treated aggressively.    By the time of discharge, the patient was walking well the hallways, eating food, having flatus.  Pain was well-controlled on an oral medications.  Based on meeting discharge criteria and continuing to recover, I felt it was safe for the patient to be discharged from the hospital to further recover with close followup. Postoperative recommendations were discussed in detail.  They are written as well.  Discharged Condition: good  Discharge Exam: Blood pressure 135/82, pulse 64, temperature 98.9 F (37.2 C), temperature source Oral, resp. rate 18, height 5\' 5"  (1.651 m), weight 103.7 kg, SpO2 99 %.  General: Pt awake/alert/oriented x4 in No acute distress Eyes: PERRL, normal EOM.  Sclera clear.  No icterus Neuro: CN II-XII intact w/o focal sensory/motor deficits. Lymph: No head/neck/groin lymphadenopathy Psych:  No delerium/psychosis/paranoia HENT: Normocephalic, Mucus membranes moist.  No thrush Neck: Supple, No tracheal deviation Chest: No chest wall pain w good excursion CV:  Pulses intact.  Regular rhythm MS: Normal AROM mjr joints.  No obvious deformity Abdomen: Soft.  Nondistended.  Mildly tender at incisions only.  Drain serosanguinous.  No evidence of peritonitis.  No incarcerated hernias. Ext:  SCDs BLE.  No mjr edema.  No cyanosis Skin: No petechiae / purpura   Disposition:    Follow-up Information    Michael Boston, MD. Schedule an appointment as soon as possible for a visit in 3 weeks.   Specialty: General Surgery Why: To follow up after your operation, To follow up after your hospital stay Contact information: [place] [address] [phone]  Discharge disposition: 01-Home or Self Care       Discharge Instructions    Call MD for:   Complete by: As directed     Temperature > 101.64F   Call MD for:   Complete by: As directed    Temperature > 101.64F   Call MD for:  extreme fatigue   Complete by: As directed    Call MD for:  extreme fatigue   Complete by: As directed    Call MD for:  hives   Complete by: As directed    Call MD for:  hives   Complete by: As directed    Call MD for:  persistant nausea and vomiting   Complete by: As directed    Call MD for:  persistant nausea and vomiting   Complete by: As directed    Call MD for:  redness, tenderness, or signs of infection (pain, swelling, redness, odor or green/yellow discharge around incision site)   Complete by: As directed    Call MD for:  redness, tenderness, or signs of infection (pain, swelling, redness, odor or green/yellow discharge around incision site)   Complete by: As directed    Call MD for:  severe uncontrolled pain   Complete by: As directed    Call MD for:  severe uncontrolled pain   Complete by: As directed    Diet general   Complete by: As directed    SEE ESOPHAGEAL SURGERY DIET INSTRUCTIONS  We using usually start you out on a pureed (blenderized) diet. Expect some sticking with swallowing over the next 1-2 months.   This is due to swelling around your esophagus at the wrap & hiatal diaphragm repair.  It will gradually ease off over the next few months.   Discharge instructions   Complete by: As directed    Please see discharge instruction sheets.   Also refer to any handouts/printouts that may have been given from the [facility] surgery office (if you visited [place] there before surgery) Please call our office if you have any questions or concerns [phone]   Discharge instructions   Complete by: As directed    Please see discharge instruction sheets.   Also refer to any handouts/printouts that may have been given from the [facility] surgery office (if you visited [place] there before surgery) Please call our office if you have any questions or concerns [phone]   Driving  Restrictions   Complete by: As directed    No driving until off narcotics and can safely swerve away without pain during an emergency   Driving Restrictions   Complete by: As directed    No driving until off narcotics and can safely swerve away without pain during an emergency   Increase activity slowly   Complete by: As directed    Increase activity slowly   Complete by: As directed    Lifting restrictions   Complete by: As directed    Avoid heavy lifting initially, <20 pounds at first.   Do not push through pain.   You have no specific weight limit: If it hurts to do, DON'T DO IT.    If you feel no pain, you are not injuring anything.  Pain will protect you from injury.   Coughing and sneezing are far more stressful to your incision than any lifting.   Avoid resuming heavy lifting (>50 pounds) or other intense activity until off all narcotic pain medications.   When want to exercise more, give yourself 2 weeks to  gradually get back to full intense exercise/activity.   Lifting restrictions   Complete by: As directed    Avoid heavy lifting initially, <20 pounds at first.   Do not push through pain.   You have no specific weight limit: If it hurts to do, DON'T DO IT.    If you feel no pain, you are not injuring anything.  Pain will protect you from injury.   Coughing and sneezing are far more stressful to your incision than any lifting.   Avoid resuming heavy lifting (>50 pounds) or other intense activity until off all narcotic pain medications.   When want to exercise more, give yourself 2 weeks to gradually get back to full intense exercise/activity.   May shower / Bathe   Complete by: As directed    [place].  It is fine for dressings or wounds to be washed/rinsed.  Use gentle soap & water.  This will help the incisions and/or wounds get clean & minimize infection.   May shower / Bathe   Complete by: As directed    [facility].  It is fine for dressings or wounds to be  washed/rinsed.  Use gentle soap & water.  This will help the incisions and/or wounds get clean & minimize infection.   May walk up steps   Complete by: As directed    May walk up steps   Complete by: As directed    Remove dressing in 72 hours   Complete by: As directed    You have closed incisions: Shower and bathe over these incisions with soap and water every day.  It is OK to wash over the dressings: they are waterproof. Remove all surgical dressings on postoperative day #3.  You do not need to replace dressings over the closed incisions unless you feel more comfortable with a Band-Aid covering it.   Please call our office [phone] if you have further questions.   Remove dressing in 72 hours   Complete by: As directed    You have closed incisions: Shower and bathe over these incisions with soap and water every day.  It is OK to wash over the dressings: they are waterproof. Remove all surgical dressings on postoperative day #3.  You do not need to replace dressings over the closed incisions unless you feel more comfortable with a Band-Aid covering it.   Please call our office [phone] if you have further questions.   Sexual Activity Restrictions   Complete by: As directed    Sexual activity as tolerated.  Do not push through pain.  Pain will protect you from injury.   Sexual Activity Restrictions   Complete by: As directed    Sexual activity as tolerated.  Do not push through pain.  Pain will protect you from injury.   Walk with assistance   Complete by: As directed    Walk over an hour a day.  May use a walker/cane/companion to help with balance and stamina.   Walk with assistance   Complete by: As directed    Walk over an hour a day.  May use a walker/cane/companion to help with balance and stamina.      Allergies as of 01/05/2020      Reactions   Ginger Hives   Possible reaction to ginger root   Latex Itching   Oxycodone Nausea And Vomiting, Other (See Comments)    Heart racing, flushed, "felt like a heart attack"      Medication List  STOP taking these medications   IRON PO   naproxen 125 MG/5ML suspension Commonly known as: NAPROSYN     TAKE these medications   albuterol 108 (90 Base) MCG/ACT inhaler Commonly known as: VENTOLIN HFA INHALE 2 PUFFS BY MOUTH EVERY 6 HOURS AS NEEDED FOR WHEEZING OR SHORTNESS OF BREATH What changed: See the new instructions.   CRANBERRY PO Take 1 capsule by mouth daily.   famotidine 40 MG tablet Commonly known as: PEPCID Take 1 tablet (40 mg total) by mouth 2 (two) times daily.   ferrous sulfate 325 (65 FE) MG tablet Take 325 mg by mouth daily with breakfast.   HYDROcodone-acetaminophen 5-325 MG tablet Commonly known as: Norco Take 1-2 tablets by mouth every 6 (six) hours as needed for moderate pain or severe pain.   metFORMIN 500 MG tablet Commonly known as: GLUCOPHAGE Take 1 tablet (500 mg total) by mouth daily with breakfast.   multivitamin with minerals Tabs tablet Take 1 tablet by mouth daily.   omeprazole 40 MG capsule Commonly known as: PRILOSEC Take 1 capsule (40 mg total) by mouth 2 (two) times daily.   ondansetron 4 MG tablet Commonly known as: ZOFRAN Take 1 tablet (4 mg total) by mouth every 8 (eight) hours as needed for nausea.   promethazine 25 MG suppository Commonly known as: PHENERGAN Place 1 suppository (25 mg total) rectally every 6 (six) hours as needed for nausea.   Voltaren 1 % Gel Generic drug: diclofenac Sodium Apply 2 g topically daily as needed (knee pain).       Significant Diagnostic Studies:  Results for orders placed or performed during the hospital encounter of 01/04/20 (from the past 72 hour(s))  Glucose, capillary     Status: Abnormal   Collection Time: 01/04/20  6:36 AM  Result Value Ref Range   Glucose-Capillary 100 (H) 70 - 99 mg/dL    Comment: Glucose reference range applies only to samples taken after fasting for at least 8 hours.   Comment 1  Notify RN    Comment 2 Document in Chart   Glucose, capillary     Status: Abnormal   Collection Time: 01/04/20  9:43 PM  Result Value Ref Range   Glucose-Capillary 146 (H) 70 - 99 mg/dL    Comment: Glucose reference range applies only to samples taken after fasting for at least 8 hours.    No results found.  Past Medical History:  Diagnosis Date  . Allergy    HEY FEVER  . Anemia   . Anginal pain ([place])   . Arthritis   . Asthma   . Dyspnea    On exertion  . GERD (gastroesophageal reflux disease)   . Hayfever   . Pre-diabetes     Past Surgical History:  Procedure Laterality Date  . ESOPHAGEAL MANOMETRY N/A 10/04/2019   Procedure: ESOPHAGEAL MANOMETRY (EM);  Surgeon: Mauri Pole, MD;  Location: [facility];  Service: Endoscopy;  Laterality: N/A;  . NO PAST SURGERIES      Social History   Socioeconomic History  . Marital status: Married    Spouse name: Not on file  . Number of children: 2  . Years of education: Not on file  . Highest education level: Not on file  Occupational History  . Occupation: nurse  Tobacco Use  . Smoking status: Never Smoker  . Smokeless tobacco: Never Used  Vaping Use  . Vaping Use: Never used  Substance and Sexual Activity  . Alcohol use: No  . Drug  use: No  . Sexual activity: Yes    Birth control/protection: None  Other Topics Concern  . Not on file  Social History Narrative  . Not on file   Social Determinants of Health   Financial Resource Strain:   . Difficulty of Paying Living Expenses:   Food Insecurity:   . Worried About Charity fundraiser in the Last Year:   . Arboriculturist in the Last Year:   Transportation Needs:   . Film/video editor (Medical):   Marland Kitchen Lack of Transportation (Non-Medical):   Physical Activity:   . Days of Exercise per Week:   . Minutes of Exercise per Session:   Stress:   . Feeling of Stress :   Social Connections:   . Frequency of Communication with Friends and Family:   . Frequency  of Social Gatherings with Friends and Family:   . Attends Religious Services:   . Active Member of Clubs or Organizations:   . Attends Archivist Meetings:   Marland Kitchen Marital Status:   Intimate Partner Violence:   . Fear of Current or Ex-Partner:   . Emotionally Abused:   Marland Kitchen Physically Abused:   . Sexually Abused:     Family History  Problem Relation Age of Onset  . Kidney disease Mother   . Diabetes Mother   . Other Mother        cardiomegaly  . Hypertension Sister   . Diabetes Sister   . Hypertension Brother   . Diabetes Brother   . Alcoholism Brother   . Hypertension Sister   . Diabetes Sister   . Colon cancer Maternal Grandmother   . Colon cancer Maternal Aunt   . Sleep apnea Other     Current Facility-Administered Medications  Medication Dose Route Frequency Provider Last Rate Last Admin  . 0.9 %  sodium chloride infusion   Intravenous Continuous Michael Boston, MD 50 mL/hr at 01/04/20 1705 New Bag at 01/04/20 1705  . 0.9 %  sodium chloride infusion  250 mL Intravenous PRN Michael Boston, MD      . acetaminophen (TYLENOL) tablet 1,000 mg  1,000 mg Oral Tor Netters, MD   1,000 mg at 01/05/20 0123  . albumin human 5 % solution 12.5 g  12.5 g Intravenous Q6H PRN Michael Boston, MD      . albuterol (PROVENTIL) (2.5 MG/3ML) 0.083% nebulizer solution 3 mL  3 mL Inhalation Q6H PRN Michael Boston, MD      . bisacodyl (DULCOLAX) suppository 10 mg  10 mg Rectal Daily PRN Michael Boston, MD      . chlorproMAZINE (THORAZINE) 25 mg in sodium chloride 0.9 % 25 mL IVPB  25 mg Intravenous Q6H PRN Michael Boston, MD      . dexamethasone (DECADRON) injection 4 mg  4 mg Intravenous Gorden Harms, MD   4 mg at 01/05/20 1057  . diclofenac Sodium (VOLTAREN) 1 % topical gel 2 g  2 g Topical Daily PRN Michael Boston, MD      . diphenhydrAMINE (BENADRYL) 12.5 MG/5ML elixir 12.5 mg  12.5 mg Oral Q6H PRN Michael Boston, MD      . enalaprilat (VASOTEC) injection 0.625-1.25 mg  0.625-1.25 mg  Intravenous Q6H PRN Michael Boston, MD      . enoxaparin (LOVENOX) injection 40 mg  40 mg Subcutaneous Q24H Michael Boston, MD   40 mg at 01/05/20 0954  . famotidine (PEPCID) tablet 20 mg  20 mg Oral BID W[place]ord, Cindie Laroche,  RPH      . ferrous sulfate tablet 325 mg  325 mg Oral Q breakfast Michael Boston, MD   325 mg at 01/05/20 0949  . gabapentin (NEURONTIN) capsule 300 mg  300 mg Oral BID Michael Boston, MD   300 mg at 01/05/20 0948  . HYDROmorphone (DILAUDID) injection 0.5-2 mg  0.5-2 mg Intravenous Q2H PRN Michael Boston, MD   1 mg at 01/05/20 0952  . lip balm (CARMEX) ointment 1 application  1 application Topical BID Michael Boston, MD   1 application at 40/98/11 0957  . magic mouthwash  15 mL Oral QID PRN Michael Boston, MD      . metFORMIN (GLUCOPHAGE) tablet 500 mg  500 mg Oral Q breakfast Michael Boston, MD   500 mg at 01/05/20 0949  . methocarbamol (ROBAXIN) 1,000 mg in dextrose 5 % 100 mL IVPB  1,000 mg Intravenous Q6H PRN Michael Boston, MD      . methocarbamol (ROBAXIN) tablet 750 mg  750 mg Oral Q6H PRN Michael Boston, MD   750 mg at 01/05/20 9147  . metoprolol tartrate (LOPRESSOR) injection 5 mg  5 mg Intravenous Q6H PRN Michael Boston, MD      . multivitamin with minerals tablet 1 tablet  1 tablet Oral Daily Michael Boston, MD   1 tablet at 01/05/20 0949  . ondansetron (ZOFRAN-ODT) disintegrating tablet 4 mg  4 mg Oral Q6H PRN Michael Boston, MD       Or  . ondansetron [facility]) injection 4 mg  4 mg Intravenous Q6H PRN Michael Boston, MD      . oxyCODONE (Oxy IR/ROXICODONE) immediate release tablet 5-10 mg  5-10 mg Oral Q4H PRN Michael Boston, MD      . pantoprazole (PROTONIX) EC tablet 80 mg  80 mg Oral Daily Michael Boston, MD   80 mg at 01/05/20 0951  . polyethylene glycol (MIRALAX / GLYCOLAX) packet 17 g  17 g Oral Daily PRN Michael Boston, MD      . prochlorperazine (COMPAZINE) tablet 10 mg  10 mg Oral Q6H PRN Michael Boston, MD       Or  . prochlorperazine (COMPAZINE) injection 5-10 mg  5-10 mg  Intravenous Q6H PRN Michael Boston, MD      . simethicone (MYLICON) chewable tablet 40 mg  40 mg Oral Q6H PRN Michael Boston, MD      . sodium chloride flush (NS) 0.9 % injection 3 mL  3 mL Intravenous Gorden Harms, MD   3 mL at 01/04/20 2254  . sodium chloride flush (NS) 0.9 % injection 3 mL  3 mL Intravenous PRN Michael Boston, MD      . zolpidem (AMBIEN) tablet 5 mg  5 mg Oral QHS PRN Michael Boston, MD         Allergies  Allergen Reactions  . Ginger Hives    Possible reaction to ginger root  . Latex Itching  . Oxycodone Nausea And Vomiting and Other (See Comments)    Heart racing, flushed, "felt like a heart attack"    Signed: Morton Peters, MD, FACS, MASCRS Gastrointestinal and Minimally Invasive Surgery  [facility] [address], [place], [place] [address] [phone] Fax [phone] Main/Paging  CONTACT INFORMATION: Weekday (9AM-5PM) concerns: Call [facility] main office at [phone] Weeknight (5PM-9AM) or Weekend/Holiday concerns: Check [email or web address] for General Surgery [facility] coverage (Please, do not use SecureChat as it is not reliable communication to operating surgeons for immediate patient  care)      01/05/2020, 11:06 AM

## 2020-01-05 NOTE — Progress Notes (Signed)
Patient express to RN that she feels very full and sick to her stomach after attempting to eat some of her breakfast. She did not vomit. She states it is very painful when swallowing food. MD advised RN that if patient did not feel well around after attempting food that we can monitor for another day. Will hold off on discharge for now.

## 2020-01-05 NOTE — Progress Notes (Signed)
Pharmacy IV to PO conversion  The patient is receiving famotidine by the intravenous route.  Based on criteria approved by the Pharmacy and [place], the medication is being converted to the equivalent oral dose form.   No active GI bleeding or impaired absorption  Not s/p esophagectomy  Documented ability to take oral medications for > 24 hr  Plan to continue treatment for at least 1 day  In addition, the patient is ordered Diphenhydramine by the intravenous route with a linked PO option available.  Based on criteria approved by the Pharmacy and [place], the IV option is being discontinued.   Not prescribed to treat or prevent a severe allergic reaction   Not prescribed as premedication prior to receiving blood product, biologic medication, antimicrobial, or chemotherapy agent   The patient has tolerated at least one dose of an oral or enteral medication   The patient has no evidence of active gastrointestinal bleeding or impaired GI absorption (gastrectomy, short bowel, patient on TNA or NPO).   The patient is not undergoing procedural sedation  If you have any questions about this conversion, please contact the Pharmacy Department (ext [phone]).  Thank you.  Reuel Boom, PharmD, BCPS [phone] 01/05/2020, 10:49 AM

## 2020-01-05 NOTE — Progress Notes (Signed)
Lisa Galloway [id number] 1961/01/16  CARE TEAM:  PCP: Ladell Pier, MD  Outpatient Care Team: Patient Care Team: Ladell Pier, MD as PCP - General (Internal Medicine) Lorretta Harp, MD as PCP - Cardiology (Cardiology) Michael Boston, MD as Consulting Physician (General Surgery) Mauri Pole, MD as Consulting Physician (Gastroenterology) Lorretta Harp, MD as Consulting Physician (Cardiology)  Inpatient Treatment Team: Treatment Team: Attending Provider: Michael Boston, MD; Technician: Leda Quail, NT; Utilization Review: Tressie Stalker, RN; Registered Nurse: Donia Ast, RN; Social Worker: Lia Hopping, LCSW   Problem List:   Principal Problem:   Incarcerated hiatal hernia s/p robotic repair 01/04/2020 Active Problems:   GERD without esophagitis   Prediabetes   Obesity (BMI 30-39.9)   Intermittent asthma   Atypical chest pain   Fibroids   Non-intractable vomiting   Regurgitation and rechewing   1 Day Post-Op  01/04/2020  POST-OPERATIVE DIAGNOSIS:  PARAESOPHAGEAL HIATAL HERNIA REFRACTORY TO MEDICAL MANAGEMENT  PROCEDURE:    1. ROBOTIC reduction of paraesophageal hiatal hernia 2. Type II mediastinal dissection. 3. Primary repair of hiatal hernia over pledgets.  4. Anterior & posterior gastropexy. 5. Nissen fundoplication 2 cm over a 56-French bougie 6. Mesh reinforcement with absorbable mesh  SURGEON:  Adin Hector, MD  OR FINDINGS:   Moderate-sized paraesophageal hiatal hernia with 50% of the stomach in the mediastinum.  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  No ischemia.  There was a 8 x 6 cm hiatal defect.  It is a primary repair over pledgets. Mesh reinforcement was used with Mesh was used: PhasixT Mesh (a knitted monofilament mesh scaffold using Poly-4-hydroxybutyrate (P4HB), a biologically derived, fully resorbable material)  The patient has a 2 cm Nissen  fundoplication that was done over 56-French bougie.  The patient has posterior gastropexy.  Assessment  Recovering  [facility] Stay = 1 days)  Plan:  -Dys 1 diet - teaching.  See if nutrition  -stop IVF -VTE prophylaxis- SCDs, etc -mobilize as tolerated to help recovery  D/C patient from hospital when patient meets criteria (anticipate in 0-1 day(s)):  Tolerating oral intake well Ambulating well Adequate pain control without IV medications Urinating  Having flatus Disposition planning in place   30 minutes spent in review, evaluation, examination, counseling, and coordination of care.  More than 50% of that time was spent in counseling.  01/05/2020    Subjective: (Chief complaint)  Patient had soreness yesterday but feeling much better overall  Tolerating liquids.  Minimal nausea.  No vomiting.  Her pain control.  In hallways.  Nursing feels she is recovering rather well so far.  Gastrografin swallow showing no longer obstructed.  Mild slow passage  Objective:  Vital signs:  Vitals:   01/05/20 0449 01/05/20 0505 01/05/20 0641 01/05/20 0946  BP:  101/85  135/82  Pulse:  81  64  Resp:  18  18  Temp: 98.2 F (36.8 C) 98.4 F (36.9 C)  98.9 F (37.2 C)  TempSrc: Oral Oral  Oral  SpO2:  97%  99%  Weight:   103.7 kg   Height:   5\' 5"  (1.651 m)        Intake/Output   Yesterday:  08/05 0701 - 08/06 0700 In: 3054.6 [P.O.:470; I.V.:2584.6] Out: 1480 [Urine:1200; Drains:230; Blood:50] This shift:  Total I/O In: -  Out: 400 [Urine:400]  Bowel function:  Flatus: YES  BM:  No  Drain: Serosanguinous   Physical Exam:  General: Pt  awake/alert in no acute distress Eyes: PERRL, normal EOM.  Sclera clear.  No icterus Neuro: CN II-XII intact w/o focal sensory/motor deficits. Lymph: No head/neck/groin lymphadenopathy Psych:  No delerium/psychosis/paranoia.  Oriented x 4 HENT: Normocephalic, Mucus membranes moist.  No thrush Neck: Supple, No tracheal  deviation.  No obvious thyromegaly Chest: No pain to chest wall compression.  Good respiratory excursion.  No audible wheezing CV:  Pulses intact.  Regular rhythm.  No major extremity edema MS: Normal AROM mjr joints.  No obvious deformity  Abdomen: Soft.  Nondistended.  Mildly tender at incisions only.  No evidence of peritonitis.  No incarcerated hernias.  Ext:  No deformity.  No mjr edema.  No cyanosis Skin: No petechiae / purpurea.  No major sores.  Warm and dry    Results:   Cultures: Recent Results (from the past 720 hour(s))  SARS CORONAVIRUS 2 (TAT 6-24 HRS) Nasopharyngeal Nasopharyngeal Swab     Status: None   Collection Time: 01/01/20 10:09 AM   Specimen: Nasopharyngeal Swab  Result Value Ref Range Status   SARS Coronavirus 2 NEGATIVE NEGATIVE Final    Comment: (NOTE) SARS-CoV-2 target nucleic acids are NOT DETECTED.  The SARS-CoV-2 RNA is generally detectable in upper and lower respiratory specimens during the acute phase of infection. Negative results do not preclude SARS-CoV-2 infection, do not rule out co-infections with other pathogens, and should not be used as the sole basis for treatment or other patient management decisions. Negative results must be combined with clinical observations, patient history, and epidemiological information. The expected result is Negative.  Fact Sheet for Patients: SugarRoll.be  Fact Sheet for Healthcare Providers: https://www.woods-mathews.com/  This test is not yet approved or cleared by the [place] FDA and  has been authorized for detection and/or diagnosis of SARS-CoV-2 by FDA under an Emergency Use Authorization (EUA). This EUA will remain  in effect (meaning this test can be used) for the duration of the COVID-19 declaration under Se ction 564(b)(1) of the Act, 21 U.S.C. section 360bbb-3(b)(1), unless the authorization is terminated or revoked sooner.  Performed at [facility], [place] [address], [place],  [address]     Labs: Results for orders placed or performed during the hospital encounter of 01/04/20 (from the past 48 hour(s))  Glucose, capillary     Status: Abnormal   Collection Time: 01/04/20  6:36 AM  Result Value Ref Range   Glucose-Capillary 100 (H) 70 - 99 mg/dL    Comment: Glucose reference range applies only to samples taken after fasting for at least 8 hours.   Comment 1 Notify RN    Comment 2 Document in Chart   Glucose, capillary     Status: Abnormal   Collection Time: 01/04/20  9:43 PM  Result Value Ref Range   Glucose-Capillary 146 (H) 70 - 99 mg/dL    Comment: Glucose reference range applies only to samples taken after fasting for at least 8 hours.    Imaging / Studies: DG ESOPHAGUS W SINGLE CM (SOL OR THIN BA)  Result Date: 01/05/2020 CLINICAL DATA:  Postop Nissen fundoplication yesterday. EXAM: ESOPHOGRAM/BARIUM SWALLOW TECHNIQUE: Single contrast examination was performed using water-soluble contrast (150 cc Omnipaque 300). FLUOROSCOPY TIME:  Fluoroscopy Time: 1 minutes and 6 seconds of low-dose pulsed fluoroscopy Radiation Exposure Index (if provided by the fluoroscopic device): 25.2 mGy Number of Acquired Spot Images: 2 COMPARISON:  Abdominal CT 07/17/2019 FINDINGS: Study was initiated with the patient erect. The patient swallowed the contrast without difficulty.  The esophageal motility is normal. A mediastinal drain is in place. Contrast flowed freely through the distal esophagus, although there was incomplete emptying of the esophagus with the patient erect. No evidence of esophageal leak. Subsequent further evaluation was performed in the prone and supine positions. The gastroesophageal junction is patent without leak or residual hiatal hernia. IMPRESSION: No demonstrated complication post Nissen fundoplication. Incomplete esophageal emptying. Electronically Signed   By: Richardean Sale M.D.   On: 01/05/2020 09:25     Medications / Allergies: per chart  Antibiotics: Anti-infectives (From admission, onward)   Start     Dose/Rate Route Frequency Ordered Stop   01/04/20 0600  cefTRIAXone (ROCEPHIN) 2 g in sodium chloride 0.9 % 100 mL IVPB       "And" Linked Group Details   2 g 200 mL/hr over 30 Minutes Intravenous On call to O.R. 01/04/20 9629 01/04/20 0746   01/04/20 0600  metroNIDAZOLE (FLAGYL) IVPB 500 mg       "And" Linked Group Details   500 mg 100 mL/hr over 60 Minutes Intravenous On call to O.R. 01/04/20 [id number] 01/04/20 0746        Note: Portions of this report may have been transcribed using voice recognition software. Every effort was made to ensure accuracy; however, inadvertent computerized transcription errors may be present.   Any transcriptional errors that result from this process are unintentional.    Adin Hector, MD, FACS, MASCRS Gastrointestinal and Minimally Invasive Surgery  [facility] [address], [place], [place] [address] [phone] Fax [phone] Main/Paging  CONTACT INFORMATION: Weekday (9AM-5PM) concerns: Call [facility] main office at [phone] Weeknight (5PM-9AM) or Weekend/Holiday concerns: Check [email or web address] for General Surgery [facility] coverage (Please, do not use SecureChat as it is not reliable communication to operating surgeons for immediate patient care)      01/05/2020  10:30 AM

## 2020-01-06 NOTE — Progress Notes (Signed)
Nurse reviewed discharge instructions with pt. Pt verbalized understanding of discharge instructions, follow up appointments and new medications.  All question answered and JP drain removed prior to discharge.

## 2020-01-06 NOTE — Progress Notes (Signed)
S: Tolerating a good amount of liquids, does not like any of the pured food that has been provided here in the hospital.  Does have some pain in the lower esophagus as food goes down, but denies any sticking or dysphagia.  O: Vitals, labs, intake/output, and orders reviewed at this time.    Gen: A&Ox3, no distress  H&N: EOMI, atraumatic, neck supple Chest: unlabored respirations, RRR Abd: soft, minimally, appropriately tender, nondistended, incision(s) c/d/i with dressings in place, drain output serosanguineous Ext: warm, no edema Neuro: grossly normal  Lines/tubes/drains: JP drain, PIV  A/P: Postop day 2 robotic paraesophageal hernia repair with Nissen fundoplication.  Continue puree diet as tolerated, mobilize.  Plan discharge later today, remove surgical drain at the time of discharge.   Romana Juniper, MD [facility], [place]

## 2020-01-08 ENCOUNTER — Telehealth: Payer: Self-pay

## 2020-01-08 NOTE — Telephone Encounter (Signed)
Transition Care Management Follow-up Telephone Call  Date of discharge and from where: 01/06/2020, [facility]  How have you been since you were released from the hospital? She explained that it continues to hurt when she is trying to swallow making it difficult to take pain medication She said that she gets sharp burning in her chest when she swallows.  She had not had a bowel movement until last night after  she used a fleets enema. No bowel movement today   She said that she has only tried drinking milk and that has been difficult.  When discussed calling the surgeon to report the symptoms, she said her husband is going to the store today to get some pudding, soups, jello and she will try those foods.  She understood that she is to be on a pureed diet.  She denied any nausea/vomiting.  This CM again encouraged her to call her surgeon to report the symptoms that are concerning.   Any questions or concerns?  noted above  Items Reviewed:  Did the pt receive and understand the discharge instructions provided?  yes  Medications obtained and verified?  she has all medications and did not have any questions about her med regime.   Any new allergies since your discharge? none reported  Dietary orders reviewed?  yes, she verbalized confirmation of a puree diet. Specific instructions on AVS  Do you have support at home? her husband,  No home health or DME ordered  Functional Questionnaire: (I = Independent and D = Dependent) ADLs: independent  Follow up appointments reviewed:   PCP Hospital f/u appt confirmed?she said she will call to schedule the appointment   Specialist Hospital f/u appt confirmed?she said she was given a follow up appt for surgery but it is not in Epic and she was not sure of the date.   If their condition worsens, is the pt aware to call PCP or go to the Emergency Dept.? yes  Was the patient provided with contact information for the PCP's office or ED? she has  the clinic phone number  Was to pt encouraged to call back with questions or concerns?  yes

## 2020-01-19 ENCOUNTER — Other Ambulatory Visit: Payer: Self-pay | Admitting: Internal Medicine

## 2020-01-19 DIAGNOSIS — R7303 Prediabetes: Secondary | ICD-10-CM

## 2020-01-19 NOTE — Telephone Encounter (Signed)
Requested medication (s) are due for refill today: Yes  Requested medication (s) are on the active medication list: Yes  Last refill:  12/06/18  Future visit scheduled: No  Notes to clinic:  Prescription has expired.    Requested Prescriptions  Pending Prescriptions Disp Refills   metFORMIN (GLUCOPHAGE) 500 MG tablet [Pharmacy Med Name: metFORMIN HCl 500 MG Oral Tablet] 30 tablet 0    Sig: Take 1 tablet by mouth once daily with breakfast      Endocrinology:  Diabetes - Biguanides Failed - 01/19/2020  2:33 PM      Failed - Valid encounter within last 6 months    Recent Outpatient Visits           6 months ago Elevated blood pressure reading   [facility] Van Ausdall, Stephen L, RPH-CPP   7 months ago Prediabetes   [facility] Johnson, Deborah B, MD   1 year ago Acute pain of right knee   [facility] Johnson, Deborah B, MD   3 years ago Preop general physical exam   [facility] Johnson, Deborah B, MD   4 years ago Pap smear for cervical cancer screening    [facility] Langeland, Dawn T, MD              Passed - Cr in normal range and within 360 days    Creatinine, Ser  Date Value Ref Range Status  12/26/2019 0.87 0.44 - 1.00 mg/dL Final          Passed - HBA1C is between 0 and 7.9 and within 180 days    HbA1c, POC (prediabetic range)  Date Value Ref Range Status  06/05/2019 6.3 5.7 - 6.4 % Final   Hgb A1c MFr Bld  Date Value Ref Range Status  12/26/2019 6.5 (H) 4.8 - 5.6 % Final    Comment:    (NOTE) Pre diabetes:          5.7%-6.4%  Diabetes:              >6.4%  Glycemic control for   <7.0% adults with diabetes           Passed - eGFR in normal range and within 360 days    GFR calc Af Amer  Date Value Ref Range Status  12/26/2019 >60 >60 mL/min Final   GFR calc non Af Amer  Date Value Ref Range Status   12/26/2019 >60 >60 mL/min Final            

## 2020-01-21 ENCOUNTER — Emergency Department (HOSPITAL_COMMUNITY)

## 2020-01-21 ENCOUNTER — Emergency Department (HOSPITAL_COMMUNITY)
Admission: EM | Admit: 2020-01-21 | Discharge: 2020-01-21 | Disposition: A | Discharge status: Home / Self Care | Attending: Emergency Medicine | Admitting: Emergency Medicine

## 2020-01-21 ENCOUNTER — Other Ambulatory Visit: Payer: Self-pay

## 2020-01-21 DIAGNOSIS — N644 Mastodynia: Secondary | ICD-10-CM | POA: Insufficient documentation

## 2020-01-21 DIAGNOSIS — R079 Chest pain, unspecified: Secondary | ICD-10-CM | POA: Insufficient documentation

## 2020-01-21 DIAGNOSIS — Z5321 Procedure and treatment not carried out due to patient leaving prior to being seen by health care provider: Secondary | ICD-10-CM | POA: Diagnosis not present

## 2020-01-21 DIAGNOSIS — K449 Diaphragmatic hernia without obstruction or gangrene: Secondary | ICD-10-CM | POA: Diagnosis not present

## 2020-01-21 LAB — CBC
HCT: 36.9 % (ref 36.0–46.0)
Hemoglobin: 11.2 g/dL — ABNORMAL LOW (ref 12.0–15.0)
MCH: 24 pg — ABNORMAL LOW (ref 26.0–34.0)
MCHC: 30.4 g/dL (ref 30.0–36.0)
MCV: 79.2 fL — ABNORMAL LOW (ref 80.0–100.0)
Platelets: 385 10*3/uL (ref 150–400)
RBC: 4.66 MIL/uL (ref 3.87–5.11)
RDW: 18.8 % — ABNORMAL HIGH (ref 11.5–15.5)
WBC: 6.5 10*3/uL (ref 4.0–10.5)
nRBC: 0 % (ref 0.0–0.2)

## 2020-01-21 LAB — BASIC METABOLIC PANEL
Anion gap: 9 (ref 5–15)
BUN: 14 mg/dL (ref 6–20)
CO2: 26 mmol/L (ref 22–32)
Calcium: 9.4 mg/dL (ref 8.9–10.3)
Chloride: 106 mmol/L (ref 98–111)
Creatinine, Ser: 0.86 mg/dL (ref 0.44–1.00)
GFR calc Af Amer: >60 mL/min (ref >60)
GFR calc non Af Amer: >60 mL/min (ref >60)
Glucose, Bld: 108 mg/dL — ABNORMAL HIGH (ref 70–99)
Potassium: 3.7 mmol/L (ref 3.5–5.1)
Sodium: 141 mmol/L (ref 135–145)

## 2020-01-21 LAB — TROPONIN I (HIGH SENSITIVITY): Troponin I (High Sensitivity): <2 ng/L (ref <18)

## 2020-01-21 NOTE — ED Triage Notes (Signed)
Arrived POV from home. Patient reports chest pain and burning in breasts that radiate into right shoulder. Patient had hiatal hernia surgery on January 04 2019

## 2020-01-21 NOTE — ED Notes (Signed)
Provided pt w/labeled specimen cup for urine collection. ENMiles 

## 2020-02-01 ENCOUNTER — Telehealth: Payer: Self-pay | Admitting: Internal Medicine

## 2020-02-01 ENCOUNTER — Ambulatory Visit: Payer: Self-pay | Admitting: *Deleted

## 2020-02-01 NOTE — Telephone Encounter (Signed)
pt says that she need a TB skin test for work. Pt would like further assistance with scheduling.   CB: [phone]

## 2020-02-01 NOTE — Telephone Encounter (Signed)
Returned patient call and scheduled an appt for 02/09/20 with Dr. Chapman Fitch.

## 2020-02-01 NOTE — Telephone Encounter (Signed)
Patient is having pain in chest- R side. Patient states she has pain so severe at times- she has to stop and bend over.  Patient also states she is having pain at surgery site-hernia repair 8/5.  Patient has had visits to ER for chest pain- but she had to leave before seen at last visit due to work. Patient states she is having pain now- but it is not bad- advised ED- but she is not going to go. Patient is requesting a cardiology referral.Told patient I would send note to see if she could be schedule in the office- since she has been having these symptoms for over 1 month. Reason for Disposition . [1] Chest pain (or "angina") comes and goes AND [2] is happening more often (increasing in frequency) or getting worse (increasing in severity) (Exception: chest pains that last only a few seconds)  Answer Assessment - Initial Assessment Questions 1. LOCATION: "Where does it hurt?"       R inside the breast- patient states she has pain in breast tissue 2. RADIATION: "Does the pain go anywhere else?" (e.g., into neck, jaw, arms, back)     Both sides into R shoulder 3. ONSET: "When did the chest pain begin?" (Minutes, hours or days)      Patient has had pain for over 1 month- she has been to ED several times for pain- patient was cleared for cardiac emergency 4. PATTERN "Does the pain come and go, or has it been constant since it started?"  "Does it get worse with exertion?"      Comes and goes, did get worse with exertion a couple weeks ago 5. DURATION: "How long does it last" (e.g., seconds, minutes, hours)     20 minutes- or less 6. SEVERITY: "How bad is the pain?"  (e.g., Scale 1-10; mild, moderate, or severe)    - MILD (1-3): doesn't interfere with normal activities     - MODERATE (4-7): interferes with normal activities or awakens from sleep    - SEVERE (8-10): excruciating pain, unable to do any normal activities       Can be severe at times 7. CARDIAC RISK FACTORS: "Do you have any history of  heart problems or risk factors for heart disease?" (e.g., angina, prior heart attack; diabetes, high blood pressure, high cholesterol, smoker, or strong family history of heart disease)     No heart issues with patient 8. PULMONARY RISK FACTORS: "Do you have any history of lung disease?"  (e.g., blood clots in lung, asthma, emphysema, birth control pills)     asthma 9. CAUSE: "What do you think is causing the chest pain?"     unsure 10. OTHER SYMPTOMS: "Do you have any other symptoms?" (e.g., dizziness, nausea, vomiting, sweating, fever, difficulty breathing, cough)       No 11. PREGNANCY: "Is there any chance you are pregnant?" "When was your last menstrual period?"       n/a  Protocols used: CHEST PAIN-A-AH

## 2020-02-01 NOTE — Telephone Encounter (Signed)
Returned patient call and scheduled an appt with the nurse for 02/06/20.

## 2020-02-06 ENCOUNTER — Other Ambulatory Visit: Payer: Self-pay

## 2020-02-06 ENCOUNTER — Telehealth: Payer: Self-pay | Admitting: *Deleted

## 2020-02-06 ENCOUNTER — Ambulatory Visit: Attending: Family Medicine

## 2020-02-06 DIAGNOSIS — Z111 Encounter for screening for respiratory tuberculosis: Secondary | ICD-10-CM | POA: Diagnosis not present

## 2020-02-06 NOTE — Telephone Encounter (Signed)
Open in error

## 2020-02-06 NOTE — Progress Notes (Signed)
Pt here for ppd. Ppd placed in right  arm.  Pt informed to return in 76 to 72 hours to have site read.

## 2020-02-08 ENCOUNTER — Ambulatory Visit

## 2020-02-09 ENCOUNTER — Other Ambulatory Visit: Payer: Self-pay

## 2020-02-09 ENCOUNTER — Ambulatory Visit: Attending: Family Medicine | Admitting: Family Medicine

## 2020-02-09 ENCOUNTER — Encounter: Payer: Self-pay | Admitting: Family Medicine

## 2020-02-09 VITALS — BP 119/82 | HR 59 | Temp 97.3°F | Resp 18 | Ht 65.0 in | Wt 213.0 lb

## 2020-02-09 DIAGNOSIS — R5383 Other fatigue: Secondary | ICD-10-CM | POA: Diagnosis not present

## 2020-02-09 DIAGNOSIS — E119 Type 2 diabetes mellitus without complications: Secondary | ICD-10-CM | POA: Diagnosis not present

## 2020-02-09 DIAGNOSIS — J45909 Unspecified asthma, uncomplicated: Secondary | ICD-10-CM | POA: Insufficient documentation

## 2020-02-09 DIAGNOSIS — Z79899 Other long term (current) drug therapy: Secondary | ICD-10-CM | POA: Diagnosis not present

## 2020-02-09 DIAGNOSIS — M1990 Unspecified osteoarthritis, unspecified site: Secondary | ICD-10-CM | POA: Insufficient documentation

## 2020-02-09 DIAGNOSIS — I440 Atrioventricular block, first degree: Secondary | ICD-10-CM | POA: Insufficient documentation

## 2020-02-09 DIAGNOSIS — R1310 Dysphagia, unspecified: Secondary | ICD-10-CM

## 2020-02-09 DIAGNOSIS — R001 Bradycardia, unspecified: Secondary | ICD-10-CM

## 2020-02-09 DIAGNOSIS — K219 Gastro-esophageal reflux disease without esophagitis: Secondary | ICD-10-CM | POA: Insufficient documentation

## 2020-02-09 DIAGNOSIS — Z7984 Long term (current) use of oral hypoglycemic drugs: Secondary | ICD-10-CM | POA: Diagnosis not present

## 2020-02-09 DIAGNOSIS — R9431 Abnormal electrocardiogram [ECG] [EKG]: Secondary | ICD-10-CM

## 2020-02-09 DIAGNOSIS — Z9889 Other specified postprocedural states: Secondary | ICD-10-CM

## 2020-02-09 DIAGNOSIS — R110 Nausea: Secondary | ICD-10-CM | POA: Diagnosis not present

## 2020-02-09 DIAGNOSIS — R079 Chest pain, unspecified: Secondary | ICD-10-CM | POA: Diagnosis not present

## 2020-02-09 DIAGNOSIS — M25511 Pain in right shoulder: Secondary | ICD-10-CM | POA: Insufficient documentation

## 2020-02-09 DIAGNOSIS — Z09 Encounter for follow-up examination after completed treatment for conditions other than malignant neoplasm: Secondary | ICD-10-CM

## 2020-02-09 DIAGNOSIS — R1319 Other dysphagia: Secondary | ICD-10-CM

## 2020-02-09 DIAGNOSIS — N644 Mastodynia: Secondary | ICD-10-CM

## 2020-02-09 DIAGNOSIS — Z23 Encounter for immunization: Secondary | ICD-10-CM

## 2020-02-09 LAB — TB SKIN TEST
Induration: 0 mm
TB Skin Test: NEGATIVE

## 2020-02-09 NOTE — Progress Notes (Signed)
Established Patient Office Visit  Subjective:  Patient ID: Lisa Galloway, female    DOB: 1960/07/11  Age: 59 y.o. MRN: [id number]  CC:  Chief Complaint  Patient presents with  . Chest Pain    x over 1 month    HPI Lisa Galloway presents secondary to recurrent issues with right and mid-chest pain which radiates to her upper back and right shoulder area.  She is status post chest hospitalization from 01/04/2020 through 01/06/2020 due to an incarcerated hiatal hernia which she had robotic repair. She reports that her current chest pain does not feel the same as the chest pain that she was having prior to her hospitalization.  Follow-up with surgery after her hospitalization but reports at that time she is not having the current issues with chest pain.  She has not followed up with surgery since current episode of chest pain began.  She believes the chest pain started a few days after her follow-up visit with surgery. Chest pain is sharp. She is worried that she might have an issue with her heart. She is not have any episodes of nausea or sweating with the onset of her current chest pain. When she has the chest pain, it causes her to double over. She has to sit down and lean forward until the chest pain resolves. She does not currently have chest pain. She would like to be referred to see a heart doctor. She feels fatigued as she works third shift and just got off from work.        She does sometimes have to do some lifting at work. She has been trying to get help from others as she was told by her surgeon to avoid lifting over 20 pounds. 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 She did have some mild left lower abdominal discomfort after carrying the water x 1.  She still has difficulty swallowing and feels like food sits in her upper chest before going down. She states that her surgeon told her that this will improve over time. She does not think  that she has  seen a gastroenterologist.       She admits that she has not been taking her metformin daily, She says that she was told that she is a borderline diabetic. She is taking her acid reflux medication as prescribed. She is not taking the iron on a daily basis that was prescribed for her anemia.          Past Medical History:  Diagnosis Date  . Allergy    HEY FEVER  . Anemia   . Anginal pain ([place])   . Arthritis   . Asthma   . Dyspnea    On exertion  . GERD (gastroesophageal reflux disease)   . Hayfever   . Pre-diabetes     Past Surgical History:  Procedure Laterality Date  . ESOPHAGEAL MANOMETRY N/A 10/04/2019   Procedure: ESOPHAGEAL MANOMETRY (EM);  Surgeon: Mauri Pole, MD;  Location: [facility];  Service: Endoscopy;  Laterality: N/A;  . NO PAST SURGERIES    . XI ROBOTIC ASSISTED PARAESOPHAGEAL HERNIA REPAIR N/A 01/04/2020   Procedure: ROBOTIC REPAIR OF PARAESOPHAGEAL HIATAL HERNIA WITH FUNDOPLICATION WITH MESH, BILATERAL TAP BLOCK;  Surgeon: Michael Boston, MD;  Location: [facility];  Service: General;  Laterality: N/A;    Family History  Problem Relation Age of Onset  . Kidney disease Mother   . Diabetes Mother   .  Other Mother        cardiomegaly  . Hypertension Sister   . Diabetes Sister   . Hypertension Brother   . Diabetes Brother   . Alcoholism Brother   . Hypertension Sister   . Diabetes Sister   . Colon cancer Maternal Grandmother   . Colon cancer Maternal Aunt   . Sleep apnea Other     Social History   Socioeconomic History  . Marital status: Married    Spouse name: Not on file  . Number of children: 2  . Years of education: Not on file  . Highest education level: Not on file  Occupational History  . Occupation: nurse  Tobacco Use  . Smoking status: Never Smoker  . Smokeless tobacco: Never Used  Vaping Use  . Vaping Use: Never used  Substance and Sexual Activity  . Alcohol use: No  . Drug use: No  . Sexual activity: Yes    Birth  control/protection: None  Other Topics Concern  . Not on file  Social History Narrative  . Not on file   Social Determinants of Health   Financial Resource Strain:   . Difficulty of Paying Living Expenses: Not on file  Food Insecurity:   . Worried About Charity fundraiser in the Last Year: Not on file  . Ran Out of Food in the Last Year: Not on file  Transportation Needs:   . Lack of Transportation (Medical): Not on file  . Lack of Transportation (Non-Medical): Not on file  Physical Activity:   . Days of Exercise per Week: Not on file  . Minutes of Exercise per Session: Not on file  Stress:   . Feeling of Stress : Not on file  Social Connections:   . Frequency of Communication with Friends and Family: Not on file  . Frequency of Social Gatherings with Friends and Family: Not on file  . Attends Religious Services: Not on file  . Active Member of Clubs or Organizations: Not on file  . Attends Archivist Meetings: Not on file  . Marital Status: Not on file  Intimate Partner Violence:   . Fear of Current or Ex-Partner: Not on file  . Emotionally Abused: Not on file  . Physically Abused: Not on file  . Sexually Abused: Not on file    Outpatient Medications Prior to Visit  Medication Sig Dispense Refill  . albuterol (VENTOLIN HFA) 108 (90 Base) MCG/ACT inhaler INHALE 2 PUFFS BY MOUTH EVERY 6 HOURS AS NEEDED FOR WHEEZING OR SHORTNESS OF BREATH (Patient taking differently: Inhale 2 puffs into the lungs every 6 (six) hours as needed for wheezing or shortness of breath. ) 18 g 2  . CRANBERRY PO Take 1 capsule by mouth daily.    . diclofenac Sodium (VOLTAREN) 1 % GEL Apply 2 g topically daily as needed (knee pain).    . famotidine (PEPCID) 40 MG tablet Take 1 tablet (40 mg total) by mouth 2 (two) times daily. 60 tablet 5  . ferrous sulfate 325 (65 FE) MG tablet Take 325 mg by mouth daily with breakfast.    . HYDROcodone-acetaminophen (NORCO) 5-325 MG tablet Take 1-2 tablets  by mouth every 6 (six) hours as needed for moderate pain or severe pain. 30 tablet 0  . metFORMIN (GLUCOPHAGE) 500 MG tablet Take 1 tablet (500 mg total) by mouth daily with breakfast. Must have office visit for refills 30 tablet 0  . Multiple Vitamin (MULTIVITAMIN WITH MINERALS) TABS tablet Take 1  tablet by mouth daily.    Marland Kitchen omeprazole (PRILOSEC) 40 MG capsule Take 1 capsule (40 mg total) by mouth 2 (two) times daily. 60 capsule 5  . ondansetron (ZOFRAN) 4 MG tablet Take 1 tablet (4 mg total) by mouth every 8 (eight) hours as needed for nausea. 8 tablet 5  . promethazine (PHENERGAN) 25 MG suppository Place 1 suppository (25 mg total) rectally every 6 (six) hours as needed for nausea. 5 suppository 5   No facility-administered medications prior to visit.    Allergies  Allergen Reactions  . Ginger Hives    Possible reaction to ginger root  . Latex Itching  . Oxycodone Nausea And Vomiting and Other (See Comments)    Heart racing, flushed, "felt like a heart attack"    ROS Review of Systems  Constitutional: Positive for fatigue. Negative for chills and fever.  HENT: Positive for trouble swallowing. Negative for sore throat.   Eyes: Negative for photophobia and visual disturbance.       Wears glasses  Respiratory: Negative for cough and shortness of breath.   Cardiovascular: Positive for chest pain and leg swelling (occasional). Negative for palpitations.  Gastrointestinal: Positive for nausea. Negative for abdominal pain, constipation and diarrhea.  Endocrine: Negative for polydipsia, polyphagia and polyuria.  Genitourinary: Negative for dysuria and frequency.  Musculoskeletal: Positive for arthralgias. Negative for back pain.  Skin: Negative for rash and wound.  Neurological: Positive for dizziness and headaches.       Reports chronic top of head headaches and dizziness  Hematological: Negative for adenopathy. Does not bruise/bleed easily.  Psychiatric/Behavioral: Positive for sleep  disturbance. The patient is nervous/anxious (about health).       Objective:    Physical Exam Vitals and nursing note reviewed.  Constitutional:      General: She is not in acute distress.    Appearance: She is well-developed. She is not ill-appearing.  Neck:     Vascular: No hepatojugular reflux or JVD.  Cardiovascular:     Rate and Rhythm: Normal rate and regular rhythm.  Pulmonary:     Effort: Pulmonary effort is normal.     Breath sounds: Normal breath sounds.  Abdominal:     Palpations: Abdomen is soft.     Tenderness: There is abdominal tenderness. There is no right CVA tenderness, left CVA tenderness, guarding or rebound.     Comments: Mild mid to lower bilateral abdominal discomfort to palpation  Musculoskeletal:        General: Tenderness present.     Cervical back: Normal range of motion and neck supple.     Right lower leg: No edema.     Left lower leg: No edema.     Comments: Patient with reproducible chest pain with palpation over the right upper and medial  chest but she also has tenderness in both breasts on breast exam. Some fullness in the right axilla but no palpable adenopathy  Skin:    General: Skin is warm and dry.  Neurological:     General: No focal deficit present.     Mental Status: She is alert and oriented to person, place, and time.  Psychiatric:     Comments: Appears anxious     BP 119/82 (BP Location: Right Arm, Patient Position: Sitting, Cuff Size: Large)   Pulse (!) 59   Temp (!) 97.3 F (36.3 C) (Temporal)   Resp 18   Ht 5\' 5"  (1.651 m)   Wt 213 lb (96.6 kg)   SpO2 98%  BMI 35.45 kg/m  Wt Readings from Last 3 Encounters:  02/09/20 213 lb (96.6 kg)  01/05/20 228 lb 11.2 oz (103.7 kg)  12/26/19 (!) 227 lb 7 oz (103.2 kg)     Health Maintenance Due  Topic Date Due  . COVID-19 Vaccine (1) Never done     No results found for: TSH Lab Results  Component Value Date   WBC 6.5 01/21/2020   HGB 11.2 (L) 01/21/2020   HCT 36.9  01/21/2020   MCV 79.2 (L) 01/21/2020   PLT 385 01/21/2020   Lab Results  Component Value Date   NA 141 01/21/2020   K 3.7 01/21/2020   CO2 26 01/21/2020   GLUCOSE 108 (H) 01/21/2020   BUN 14 01/21/2020   CREATININE 0.86 01/21/2020   BILITOT <0.2 06/05/2019   ALKPHOS 126 (H) 06/05/2019   AST 29 06/05/2019   ALT 32 06/05/2019   PROT 7.1 06/05/2019   ALBUMIN 4.2 06/05/2019   CALCIUM 9.4 01/21/2020   ANIONGAP 9 01/21/2020   Lab Results  Component Value Date   CHOL 157 06/05/2019   Lab Results  Component Value Date   HDL 83 06/05/2019   Lab Results  Component Value Date   LDLCALC 59 06/05/2019   Lab Results  Component Value Date   TRIG 77 06/05/2019   Lab Results  Component Value Date   CHOLHDL 1.9 06/05/2019   Lab Results  Component Value Date   HGBA1C 6.5 (H) 12/26/2019      Assessment & Plan:  1. Chest pain, unspecified type EKG done due to patient's atypical chest pain. She has had a recent hospitalization for a hiatal hernia which may be contributing to her chest pain. On exam, she has some findings suggestive of a musculoskeletal origin of her pain. Pain could also be related to costochondritis. She also has breast tenderness on exam and will be scheduled for a diagnostic mammogram.  - Ambulatory referral to Cardiology - CBC - T4 AND TSH - H Pylori, IGM, IGG, IGA AB  2. Abnormal finding on EKG Patient with mild bradycardia which is likely not significant as pulse is at 59.  She does have findings of first-degree AV block on EKG.  She continues to have atypical chest pain.  Referral placed for patient to follow-up with cardiology. - Ambulatory referral to Cardiology  3. Type 2 diabetes mellitus without complication, without long-term current use of insulin ([place]) Patient with hemoglobin A1c of 6.5 on 12/26/2019.  She reports that she is currently on Metformin but has not been compliant with daily use.  She denies any current increased thirst or urinary  frequency.  She will have a BMP at today's visit and follow-up.  She is asked to be compliant with medication.  Information on diabetes basics provided as part of AVS. - Basic Metabolic Panel  4. Bradycardia Mild bradycardia on exam.  Will check CBC, T4 and TSH and BMP to look for possible causes such as electrolyte abnormality, hypothyroidism or blood disorder. - CBC - T4 AND TSH - Basic Metabolic Panel  5. Esophageal dysphagia; 6.  Status post laparoscopic Nissen fundoplication She reports that she has had follow-up with her surgeon status post surgery for repair of paraesophageal hernia.  She continues to have difficulty with sensation of food being stuck in her throat/upper chest when eating.  She states that she was told by her surgeons that this will gradually improve over the next 3 months.  As she continues to have nausea as  well as atypical chest pain, will check H. pylori antibodies at today's visit. - H Pylori, IGM, IGG, IGA AB  7. Need for immunization against influenza Influenza immunization offered which patient agreed to have done at today's visit.  Educational material on influenza immunization also provided.  8. Breast pain Patient with breast pain on examination in the right upper chest, right mid chest at approximately 3 o'clock position as well as pain with tenderness to palpation in the left lateral breast.  She will be referred for diagnostic mammogram/ultrasound. - MM Digital Diagnostic Bilat; Future  9. Nausea She is status post repair of paraesophageal hernia.  She is currently on reflux medicine per patient report.  Will check H. pylori antibodies due to continued nausea and atypical chest pain.  She reports that she does have prescribed medication to take as needed for her nausea. - H Pylori, IGM, IGG, IGA AB   Follow-up: Return in about 2 weeks (around 02/23/2020) for chest pain- 2 weeks with PCP/Johnson.   Antony Blackbird, MD

## 2020-02-09 NOTE — Progress Notes (Signed)
Chest pain, upper R x greater than 1 month, requesting cardiology referral  Willing to get flu vaccine today if recommended, last dose on file was 06/25/19.

## 2020-02-09 NOTE — Patient Instructions (Addendum)
Diabetes Basics  Diabetes (diabetes mellitus) is a long-term (chronic) disease. It occurs when the body does not properly use sugar (glucose) that is released from food after you eat. Diabetes may be caused by one or both of these problems:  Your pancreas does not make enough of a hormone called insulin.  Your body does not react in a normal way to insulin that it makes. Insulin lets sugars (glucose) go into cells in your body. This gives you energy. If you have diabetes, sugars cannot get into cells. This causes high blood sugar (hyperglycemia). Follow these instructions at home: How is diabetes treated? You may need to take insulin or other diabetes medicines daily to keep your blood sugar in balance. Take your diabetes medicines every day as told by your doctor. List your diabetes medicines here: Diabetes medicines  Name of medicine: ______________________________ ? Amount (dose): _______________ Time (a.m./p.m.): _______________ Notes: ___________________________________  Name of medicine: ______________________________ ? Amount (dose): _______________ Time (a.m./p.m.): _______________ Notes: ___________________________________  Name of medicine: ______________________________ ? Amount (dose): _______________ Time (a.m./p.m.): _______________ Notes: ___________________________________ If you use insulin, you will learn how to give yourself insulin by injection. You may need to adjust the amount based on the food that you eat. List the types of insulin you use here: Insulin  Insulin type: ______________________________ ? Amount (dose): _______________ Time (a.m./p.m.): _______________ Notes: ___________________________________  Insulin type: ______________________________ ? Amount (dose): _______________ Time (a.m./p.m.): _______________ Notes: ___________________________________  Insulin type: ______________________________ ? Amount (dose): _______________ Time (a.m./p.m.):  _______________ Notes: ___________________________________  Insulin type: ______________________________ ? Amount (dose): _______________ Time (a.m./p.m.): _______________ Notes: ___________________________________  Insulin type: ______________________________ ? Amount (dose): _______________ Time (a.m./p.m.): _______________ Notes: ___________________________________ How do I manage my blood sugar?  Check your blood sugar levels using a blood glucose monitor as directed by your doctor. Your doctor will set treatment goals for you. Generally, you should have these blood sugar levels:  Before meals (preprandial): 80-130 mg/dL (4.4-7.2 mmol/L).  After meals (postprandial): below 180 mg/dL (10 mmol/L).  A1c level: less than 7%. Write down the times that you will check your blood sugar levels: Blood sugar checks  Time: _______________ Notes: ___________________________________  Time: _______________ Notes: ___________________________________  Time: _______________ Notes: ___________________________________  Time: _______________ Notes: ___________________________________  Time: _______________ Notes: ___________________________________  Time: _______________ Notes: ___________________________________  What do I need to know about low blood sugar? Low blood sugar is called hypoglycemia. This is when blood sugar is at or below 70 mg/dL (3.9 mmol/L). Symptoms may include:  Feeling: ? Hungry. ? Worried or nervous (anxious). ? Sweaty and clammy. ? Confused. ? Dizzy. ? Sleepy. ? Sick to your stomach (nauseous).  Having: ? A fast heartbeat. ? A headache. ? A change in your vision. ? Tingling or no feeling (numbness) around the mouth, lips, or tongue. ? Jerky movements that you cannot control (seizure).  Having trouble with: ? Moving (coordination). ? Sleeping. ? Passing out (fainting). ? Getting upset easily (irritability). Treating low blood sugar To treat low blood  sugar, eat or drink something sugary right away. If you can think clearly and swallow safely, follow the 15:15 rule:  Take 15 grams of a fast-acting carb (carbohydrate). Talk with your doctor about how much you should take.  Some fast-acting carbs are: ? Sugar tablets (glucose pills). Take 3-4 glucose pills. ? 6-8 pieces of hard candy. ? 4-6 oz (120-150 mL) of fruit juice. ? 4-6 oz (120-150 mL) of regular (not diet) soda. ? 1 Tbsp (15 mL) honey or sugar.    Check your blood sugar 15 minutes after you take the carb.  If your blood sugar is still at or below 70 mg/dL (3.9 mmol/L), take 15 grams of a carb again.  If your blood sugar does not go above 70 mg/dL (3.9 mmol/L) after 3 tries, get help right away.  After your blood sugar goes back to normal, eat a meal or a snack within 1 hour. Treating very low blood sugar If your blood sugar is at or below 54 mg/dL (3 mmol/L), you have very low blood sugar (severe hypoglycemia). This is an emergency. Do not wait to see if the symptoms will go away. Get medical help right away. Call your local emergency services (911 in the [place] Do not drive yourself to the hospital. Questions to ask your health care provider  Do I need to meet with a diabetes educator?  What equipment will I need to care for myself at home?  What diabetes medicines do I need? When should I take them?  How often do I need to check my blood sugar?  What number can I call if I have questions?  When is my next doctor's visit?  Where can I find a support group for people with diabetes? Where to find more information  American Diabetes Association: www.diabetes.org  American Association of Diabetes Educators: www.diabeteseducator.org/patient-resources Contact a doctor if:  Your blood sugar is at or above 240 mg/dL (13.3 mmol/L) for 2 days in a row.  You have been sick or have had a fever for 2 days or more, and you are not getting better.  You have any of these  problems for more than 6 hours: ? You cannot eat or drink. ? You feel sick to your stomach (nauseous). ? You throw up (vomit). ? You have watery poop (diarrhea). Get help right away if:  Your blood sugar is lower than 54 mg/dL (3 mmol/L).  You get confused.  You have trouble: ? Thinking clearly. ? Breathing. Summary  Diabetes (diabetes mellitus) is a long-term (chronic) disease. It occurs when the body does not properly use sugar (glucose) that is released from food after digestion.  Take insulin and diabetes medicines as told.  Check your blood sugar every day, as often as told.  Keep all follow-up visits as told by your doctor. This is important. This information is not intended to replace advice given to you by your health care provider. Make sure you discuss any questions you have with your health care provider. Document Revised: 02/08/2019 Document Reviewed: 08/20/2017 Elsevier Patient Education  [place].  First-Degree Atrioventricular Block  What is atrioventricular block? Atrioventricular (AV) block, also called heart block, is a problem with the system that controls how often the heart beats (heart rate) and the pattern of heart beats (heart rhythm). In this condition, the signals that travel from the heart's upper chambers (atria) to its lower chambers (ventricles) move too slowly or are interrupted. There are several types of heart block:  First-degree.  Second-degree.  Third-degree or complete. What is first-degree heart block? First-degree AV block is the least serious type of heart block. In this condition, the signals that control heart rate move too slowly. As a result, the heart may beat more slowly than normal. First-degree AV block can increase your risk of developing a type of irregular heartbeat called atrial fibrillation. What are the causes? This condition may be caused by:  Any condition that damages the system that controls the heart's rate  and rhythm,  such as a heart attack.  Overstimulation of the nerve that slows down heart rate (vagus nerve). This cause is common among well-conditioned athletes.  Some medicines that slow down heart rate, such as beta blockers or calcium channel blockers.  Surgery that damages the heart. Some people are born with this condition (congenital heart block), but most people develop it over time. What increases the risk? The risk for this condition increases with age. You are also more likely to develop this condition if you have:  A history of heart attack.  Heart failure.  Coronary heart disease.  Inflammation of heart muscle (myocarditis).  Disease of heart muscle (cardiomyopathy).  Infection of the heart valves (endocarditis).  Infections or diseases that affect the heart. These include: ? Lyme disease. ? Sarcoidosis. ? Hemochromatosis. ? Rheumatic fever. ? Muscle disorders including Lev disease and Lenegre disease. Babies are more likely to be born with heart block if:  The mother has an autoimmune disease, such as lupus.  The baby is born with a heart defect that affects the heart's structure.  A parent was born with a heart defect. What are the signs or symptoms? This condition usually does not cause any symptoms. How is this diagnosed? This condition may be diagnosed based on:  A physical exam.  Your medical history.  A measurement of your pulse or heartbeat.  Tests. These may include: ? An electrocardiogram (ECG). This test is done to check for problems with electrical activity in the heart. ? A Holter monitor or event monitor test. This test involves wearing a portable device that monitors your heart rate over time. ? An electrophysiology (EP) study. This test involves having long, thin tubes (catheters) placed in the heart. This test records electrical signals in the heart. How is this treated? Usually, treatment is not needed for this condition. In some  cases, treatment involves:  Treating an underlying condition, such as heart disease.  Changing or stopping any heart medicines that can cause heart block. Follow these instructions at home:   Take over-the-counter and prescription medicines only as told by your health care provider.  Work with your health care provider to control lifestyle choices that increase your risk for heart disease. You may need to: ? Get regular exercise. Each week, try to get 150 minutes of moderate-intensity activity (such as walking or yoga) or 75 minutes of vigorous activity (such as running or swimming). Ask your health care provider what type of exercise is safe for you. ? Eat a heart-healthy diet with fruits and vegetables, whole grains, low-fat dairy products, and lean proteins like poultry and eggs. Your health care provider or diet and nutrition specialist (dietitian) can help you make healthy choices. ? Maintain a healthy weight. ? Limit alcohol intake to no more than 1 drink per day for nonpregnant women and 2 drinks per day for men. One drink equals 12 oz of beer, 5 oz of wine, or 1 oz of hard liquor.  Do not use any products that contain nicotine or tobacco, such as cigarettes and e-cigarettes. If you need help quitting, ask your health care provider.  Keep all follow-up visits as told by your health care provider. This is important. Contact a health care provider if:  You feel like your heart is skipping beats.  You feel more tired than normal.  You have swelling in your hands, feet, or lower legs. Get help right away if:  Your symptoms change or they get worse.  You develop new symptoms.  You have chest pain, especially if the pain: ? Feels like crushing or pressure. ? Spreads to your arms, back, neck, or jaw.  You feel short of breath.  You feel light-headed or weak.  You faint. Summary  First-degree AV block is the least serious type of heart block. In this condition, the signals  that control heart rate move too slowly. As a result, the heart may beat more slowly than normal.  Usually, treatment is not needed for this condition. In some cases, you may need to change or stop medications that may be making the condition worse.  Healthy lifestyle choices such as exercising regularly, eating a healthy diet, and limiting alcohol are good for your heart. This information is not intended to replace advice given to you by your health care provider. Make sure you discuss any questions you have with your health care provider. Document Revised: 06/18/2017 Document Reviewed: 01/03/2016 Elsevier Patient Education  2020 [employer].

## 2020-02-12 NOTE — Progress Notes (Signed)
Cardiology Office Note   Date:  02/13/2020   ID:  Lisa Galloway, DOB 1960-07-02, MRN [id number]  PCP:  Ladell Pier, MD  Cardiologist:  Quay Burow, MD EP: None  Chief Complaint  Patient presents with  . Chest Pain      History of Present Illness: Lisa Galloway is a 59 y.o. female with PMMH of atypical chest pain, DM type 2, hiatal hernia, GERD, and obesity, who presents for the evaluation of chest pain.  She was last evaluated by cardiology at an outpatient visit with Dr. Gwenlyn Found 06/2019 at which time she had continued atypical chest pain occurring daily and lasting for hours with radiation of pain down her RUE. She was recommended to undergo a coronary CTA to further evaluate her symptoms which showed a coronary calcium score of 0 with normal coronary arteries and no evidence of CAD. Echocardiogram 06/2019 showed EF 55-60%, no LVH, no RWMA, moderately dilated LAE, and no significant valvular abnormalities.   Since her last visit with cardiology she underwent surgical repair of her hiatal hernia 12/2019 which appears to have not had any complications. She was seen in her PCP's office 02/09/20 with complaints of sharp chest pain which she felt was different than her previous chest pain when she underwent the above work-up and was concerned it may be heart related, prompting this visit.   She presents today and reports ongoing episodes of sharp chest pain with radiation to right shoulder, associated with flushing, sweating, and SOB. She does have occasional palpitations. This happens several times a week and lasts for 20-25 minutes before resolving spontaneously. She does have TTP of chest wall, worse over the right breast. She reports a particularly bad episode several weeks ago and presented to the ED, though left prior to being evaluated as she needed to get some rest before her next work shift. HsTrop was negative at that time and EKG non-ischemic. She is very worried about  her ongoing chest pain. She asks about the accuracy of her previous testing. No complaints of exertional chest pain, DOE, dizziness, lightheadedness, significant LE edema, or syncope.    Past Medical History:  Diagnosis Date  . Allergy    HEY FEVER  . Anemia   . Anginal pain ([place])   . Arthritis   . Asthma   . Dyspnea    On exertion  . GERD (gastroesophageal reflux disease)   . Hayfever   . Pre-diabetes     Past Surgical History:  Procedure Laterality Date  . ESOPHAGEAL MANOMETRY N/A 10/04/2019   Procedure: ESOPHAGEAL MANOMETRY (EM);  Surgeon: Mauri Pole, MD;  Location: [facility];  Service: Endoscopy;  Laterality: N/A;  . NO PAST SURGERIES    . XI ROBOTIC ASSISTED PARAESOPHAGEAL HERNIA REPAIR N/A 01/04/2020   Procedure: ROBOTIC REPAIR OF PARAESOPHAGEAL HIATAL HERNIA WITH FUNDOPLICATION WITH MESH, BILATERAL TAP BLOCK;  Surgeon: Michael Boston, MD;  Location: [facility];  Service: General;  Laterality: N/A;     Current Outpatient Medications  Medication Sig Dispense Refill  . albuterol (VENTOLIN HFA) 108 (90 Base) MCG/ACT inhaler INHALE 2 PUFFS BY MOUTH EVERY 6 HOURS AS NEEDED FOR WHEEZING OR SHORTNESS OF BREATH (Patient taking differently: Inhale 2 puffs into the lungs every 6 (six) hours as needed for wheezing or shortness of breath. ) 18 g 2  . CRANBERRY PO Take 1 capsule by mouth daily.    . diclofenac Sodium (VOLTAREN) 1 % GEL Apply 2 g topically daily as needed (knee  pain).    . famotidine (PEPCID) 40 MG tablet Take 1 tablet (40 mg total) by mouth 2 (two) times daily. 60 tablet 5  . ferrous sulfate 325 (65 FE) MG tablet Take 325 mg by mouth daily with breakfast.    . HYDROcodone-acetaminophen (NORCO) 5-325 MG tablet Take 1-2 tablets by mouth every 6 (six) hours as needed for moderate pain or severe pain. 30 tablet 0  . metFORMIN (GLUCOPHAGE) 500 MG tablet Take 1 tablet (500 mg total) by mouth daily with breakfast. Must have office visit for refills 30 tablet 0  . Multiple  Vitamin (MULTIVITAMIN WITH MINERALS) TABS tablet Take 1 tablet by mouth daily.    Marland Kitchen omeprazole (PRILOSEC) 40 MG capsule Take 1 capsule (40 mg total) by mouth 2 (two) times daily. 60 capsule 5  . ondansetron (ZOFRAN) 4 MG tablet Take 1 tablet (4 mg total) by mouth every 8 (eight) hours as needed for nausea. 8 tablet 5  . promethazine (PHENERGAN) 25 MG suppository Place 1 suppository (25 mg total) rectally every 6 (six) hours as needed for nausea. 5 suppository 5   No current facility-administered medications for this visit.    Allergies:   Ginger, Latex, and Oxycodone    Social History:  The patient  reports that she has never smoked. She has never used smokeless tobacco. She reports that she does not drink alcohol and does not use drugs.   Family History:  The patient's family history includes Alcoholism in her brother; Colon cancer in her maternal aunt and maternal grandmother; Diabetes in her brother, mother, sister, and sister; Hypertension in her brother, sister, and sister; Kidney disease in her mother; Other in her mother; Sleep apnea in an other family member.    ROS:  Please see the history of present illness.   Otherwise, review of systems are positive for none.   All other systems are reviewed and negative.    PHYSICAL EXAM: VS:  BP 112/78   Pulse (!) 59   Temp 98.2 F (36.8 C)   Ht 5\' 5"  (1.651 m)   Wt 209 lb 9.6 oz (95.1 kg)   SpO2 98%   BMI 34.88 kg/m  , BMI Body mass index is 34.88 kg/m. GEN: Well nourished, well developed, in no acute distress HEENT: sclera anicteric Neck: no JVD, carotid bruits, or masses Cardiac: RRR; no murmurs, rubs, or gallops, no edema  Respiratory:  clear to auscultation bilaterally, normal work of breathing GI: soft, nontender, nondistended, + BS MS: no deformity or atrophy Skin: warm and dry, no rash Neuro:  Strength and sensation are intact Psych: euthymic mood, full affect   EKG:  EKG is ordered today. The ekg ordered today  demonstrates sinus bradycardia, rate 59 bpm, non-specific T wave abnormalities (chronic); no significant change from previous.    Recent Labs: 06/05/2019: ALT 32 02/09/2020: BUN 10; Creatinine, Ser 0.80; Hemoglobin 11.0; Platelets 322; Potassium 4.2; Sodium 141; TSH 1.170    Lipid Panel    Component Value Date/Time   CHOL 157 06/05/2019 1111   TRIG 77 06/05/2019 1111   HDL 83 06/05/2019 1111   CHOLHDL 1.9 06/05/2019 1111   LDLCALC 59 06/05/2019 1111      Wt Readings from Last 3 Encounters:  02/13/20 209 lb 9.6 oz (95.1 kg)  02/09/20 213 lb (96.6 kg)  01/05/20 228 lb 11.2 oz (103.7 kg)      Other studies Reviewed: Additional studies/ records that were reviewed today include:   Coronary CTA 06/2019: IMPRESSION: 1.  Coronary calcium score of 0. This was 0 percentile for age and sex matched control.  2. Normal coronary origin with right dominance.  3. CAD-RADS 0. No evidence of CAD (0%). Consider non-atherosclerotic causes of chest pain.  Echocardiogram 06/2019: 1. Left ventricular ejection fraction, by visual estimation, is 55 to  60%. The left ventricle has normal function. There is no left ventricular  hypertrophy.  2. The left ventricle has no regional wall motion abnormalities.  3. Global right ventricle has normal systolic function.The right  ventricular size is normal. No increase in right ventricular wall  thickness.  4. Left atrial size was moderately dilated.  5. Right atrial size was normal.  6. The mitral valve is normal in structure. No evidence of mitral valve  regurgitation. No evidence of mitral stenosis.  7. The tricuspid valve is normal in structure.  8. The tricuspid valve is normal in structure. Tricuspid valve  regurgitation is not demonstrated.  9. The aortic valve is tricuspid. Aortic valve regurgitation is not  visualized. Mild aortic valve sclerosis without stenosis.  10. The pulmonic valve was normal in structure. Pulmonic valve    regurgitation is not visualized.  11. The inferior vena cava is normal in size with greater than 50%  respiratory variability, suggesting right atrial pressure of 3 mmHg.     ASSESSMENT AND PLAN:  1. Atypical chest pain: she describes sharp chest pain that sometimes cause her to double over. She is s/p hiatal hernia repair surgery 12/2019. Very reassuring cardiac work-up 06/2019 including a Coronary CTA with a calcium score of 0 and no evidence of CAD and an echocardiogram with EF 55-60%, no RWMA, and no significant valvular abnormalities. Doubtful this is cardiac in nature. While my suspicions are quite low for cardiac etiology, she asks about further cardiac testing for peace of mind - Will update an echocardiogram to re-evaluate LV function and wall motion  - Will check a 1 week zio patch monitor to r/o arrhythmia as she notes some palpitations - If above work-up is negative, would consider non-cardiac causes of her chest pain.   2. DM type 2: A1C 6.5 11/2019; at goal of <7 - Continue metformin per PCP  3. Hiatal hernia s/p repair 12/2019: she still has some odynophagia and sensation of food getting stuck. Possible she is experiencing esophageal spasms?  - Will refer to GI for further evaluation - Continue routine monitoring per surgery   4. GERD: recent Hpylori test was negative.  - Continue PPI BID  5. RUQ pain: she has occasional fullness/pain in her RUQ. With recent issues with chest pain, radiating to her RUQ, question whether there is an underlying GI etiology of her chest pain.  - Will refer to GI for further evaluation     Current medicines are reviewed at length with the patient today.  The patient does not have concerns regarding medicines.  The following changes have been made:  As above  Labs/ tests ordered today include:   Orders Placed This Encounter  Procedures  . Ambulatory referral to Gastroenterology  . LONG TERM MONITOR (3-14 DAYS)  . EKG 12-Lead  .  ECHOCARDIOGRAM COMPLETE     Disposition:   FU with me in 2 months  Signed, Abigail Butts, PA-C  02/13/2020 1:43 PM

## 2020-02-13 ENCOUNTER — Ambulatory Visit (INDEPENDENT_AMBULATORY_CARE_PROVIDER_SITE_OTHER): Admitting: Medical

## 2020-02-13 ENCOUNTER — Other Ambulatory Visit: Payer: Self-pay

## 2020-02-13 ENCOUNTER — Telehealth: Payer: Self-pay | Admitting: Radiology

## 2020-02-13 ENCOUNTER — Encounter: Payer: Self-pay | Admitting: Medical

## 2020-02-13 VITALS — BP 112/78 | HR 59 | Temp 98.2°F | Ht 65.0 in | Wt 209.6 lb

## 2020-02-13 DIAGNOSIS — R0789 Other chest pain: Secondary | ICD-10-CM | POA: Diagnosis not present

## 2020-02-13 DIAGNOSIS — K219 Gastro-esophageal reflux disease without esophagitis: Secondary | ICD-10-CM

## 2020-02-13 DIAGNOSIS — R1011 Right upper quadrant pain: Secondary | ICD-10-CM | POA: Diagnosis not present

## 2020-02-13 DIAGNOSIS — E119 Type 2 diabetes mellitus without complications: Secondary | ICD-10-CM

## 2020-02-13 DIAGNOSIS — R002 Palpitations: Secondary | ICD-10-CM

## 2020-02-13 LAB — H PYLORI, IGM, IGG, IGA AB
H pylori, IgM Abs: <9 U (ref 0.0–8.9)
H. pylori, IgA Abs: <9 U (ref 0.0–8.9)
H. pylori, IgG AbS: 0.35 {index_val} (ref 0.00–0.79)

## 2020-02-13 LAB — CBC
Hematocrit: 35.2 % (ref 34.0–46.6)
Hemoglobin: 11 g/dL — ABNORMAL LOW (ref 11.1–15.9)
MCH: 24.4 pg — ABNORMAL LOW (ref 26.6–33.0)
MCHC: 31.3 g/dL — ABNORMAL LOW (ref 31.5–35.7)
MCV: 78 fL — ABNORMAL LOW (ref 79–97)
Platelets: 322 x10E3/uL (ref 150–450)
RBC: 4.51 x10E6/uL (ref 3.77–5.28)
RDW: 17.2 % — ABNORMAL HIGH (ref 11.7–15.4)
WBC: 7.5 x10E3/uL (ref 3.4–10.8)

## 2020-02-13 LAB — T4 AND TSH
T4, Total: 6.6 ug/dL (ref 4.5–12.0)
TSH: 1.17 u[IU]/mL (ref 0.450–4.500)

## 2020-02-13 LAB — BASIC METABOLIC PANEL WITH GFR
BUN/Creatinine Ratio: 13 (ref 9–23)
BUN: 10 mg/dL (ref 6–24)
CO2: 23 mmol/L (ref 20–29)
Calcium: 9.5 mg/dL (ref 8.7–10.2)
Chloride: 105 mmol/L (ref 96–106)
Creatinine, Ser: 0.8 mg/dL (ref 0.57–1.00)
GFR calc Af Amer: 94 mL/min/1.73
GFR calc non Af Amer: 82 mL/min/1.73
Glucose: 111 mg/dL — ABNORMAL HIGH (ref 65–99)
Potassium: 4.2 mmol/L (ref 3.5–5.2)
Sodium: 141 mmol/L (ref 134–144)

## 2020-02-13 NOTE — Telephone Encounter (Signed)
Enrolled patient for a 7 day Zio XT monitor to be mailed to patients home.  

## 2020-02-13 NOTE — Patient Instructions (Addendum)
Medication Instructions:  Your physician recommends that you continue on your current medications as directed. Please refer to the Current Medication list given to you today.  *If you need a refill on your cardiac medications before your next appointment, please call your pharmacy*  Lab Work: NONE ordered at this time of appointment   If you have labs (blood work) drawn today and your tests are completely normal, you will receive your results only by: Marland Kitchen MyChart Message (if you have MyChart) OR . A paper copy in the mail If you have any lab test that is abnormal or we need to change your treatment, we will call you to review the results.  Testing/Procedures: Your physician has requested that you have an echocardiogram. Echocardiography is a painless test that uses sound waves to create images of your heart. It provides your doctor with information about the size and shape of your heart and how well your heart's chambers and valves are working. This procedure takes approximately one hour. There are no restrictions for this procedure.   Please schedule for 3-4 weeks    ZIO XT- Long Term Monitor Instructions   Your physician has requested you wear your ZIO patch monitor 7 days.   This is a single patch monitor.  [employer] supplies one patch monitor per enrollment.  Additional stickers are not available.   Please do not apply patch if you will be having a Nuclear Stress Test, Echocardiogram, Cardiac CT, MRI, or Chest Xray during the time frame you would be wearing the monitor. The patch cannot be worn during these tests.  You cannot remove and re-apply the ZIO XT patch monitor.   Your ZIO patch monitor will be sent USPS Priority mail from [facility] directly to your home address. The monitor may also be mailed to a PO BOX if home delivery is not available.   It may take 3-5 days to receive your monitor after you have been enrolled.   Once you have received you monitor, please review  enclosed instructions.  Your monitor has already been registered assigning a specific monitor serial # to you.   Applying the monitor   Shave hair from upper left chest.   Hold abrader disc by orange tab.  Rub abrader in 40 strokes over left upper chest as indicated in your monitor instructions.   Clean area with 4 enclosed alcohol pads .  Use all pads to assure are is cleaned thoroughly.  Let dry.   Apply patch as indicated in monitor instructions.  Patch will be place under collarbone on left side of chest with arrow pointing upward.   Rub patch adhesive wings for 2 minutes.Remove white label marked "1".  Remove white label marked "2".  Rub patch adhesive wings for 2 additional minutes.   While looking in a mirror, press and release button in center of patch.  A small green light will flash 3-4 times .  This will be your only indicator the monitor has been turned on.     Do not shower for the first 24 hours.  You may shower after the first 24 hours.   Press button if you feel a symptom. You will hear a small click.  Record Date, Time and Symptom in the Patient Log Book.   When you are ready to remove patch, follow instructions on last 2 pages of Patient Log Book.  Stick patch monitor onto last page of Patient Log Book.   Place Patient Log Book in [place] box.  Use locking tab on box and tape box closed securely.  The [employer] has [employer] on it.  Please place in mailbox as soon as possible.  Your physician should have your test results approximately 7 days after the monitor has been mailed back to [facility].   Call [place] at [phone] if you have questions regarding your ZIO XT patch monitor.  Call them immediately if you see an orange light blinking on your monitor.   If your monitor falls off in less than 4 days contact our Monitor department at [phone].  If your monitor becomes loose or falls off after 4 days call [employer] at  [phone] for suggestions on securing your monitor.   Follow-Up: At [facility], you and your health needs are our priority.  As part of our continuing mission to provide you with exceptional heart care, we have created designated Provider Care Teams.  These Care Teams include your primary Cardiologist (physician) and Advanced Practice Providers (APPs -  Physician Assistants and Nurse Practitioners) who all work together to provide you with the care you need, when you need it.  Your next appointment:   2 month(s)  The format for your next appointment:   In Person  Provider:   Roby Lofts, PA-C  Other Instructions  You have been referred to Gastroenterology

## 2020-02-14 ENCOUNTER — Encounter: Payer: Self-pay | Admitting: *Deleted

## 2020-02-14 ENCOUNTER — Other Ambulatory Visit: Payer: Self-pay | Admitting: Family Medicine

## 2020-02-14 DIAGNOSIS — N644 Mastodynia: Secondary | ICD-10-CM

## 2020-03-04 ENCOUNTER — Ambulatory Visit: Admitting: Internal Medicine

## 2020-03-05 ENCOUNTER — Other Ambulatory Visit (HOSPITAL_COMMUNITY)

## 2020-03-05 ENCOUNTER — Encounter (HOSPITAL_COMMUNITY): Payer: Self-pay | Admitting: Medical

## 2020-03-11 ENCOUNTER — Encounter

## 2020-03-11 ENCOUNTER — Other Ambulatory Visit

## 2020-03-28 ENCOUNTER — Telehealth (HOSPITAL_COMMUNITY): Payer: Self-pay | Admitting: Medical

## 2020-03-28 NOTE — Telephone Encounter (Signed)
Patient cancelled echocardiogram VIA automates system. Left message to confirm. Patient No showed last echo on 03/05/2020.  Order will be removed from the [place] and if patient calls back to schedule we will reinstate the order. Thank you.

## 2020-04-01 ENCOUNTER — Other Ambulatory Visit (HOSPITAL_COMMUNITY)

## 2020-04-02 ENCOUNTER — Other Ambulatory Visit: Payer: Self-pay | Admitting: Nurse Practitioner

## 2020-04-02 ENCOUNTER — Other Ambulatory Visit: Payer: Self-pay | Admitting: Internal Medicine

## 2020-04-02 DIAGNOSIS — R7303 Prediabetes: Secondary | ICD-10-CM

## 2020-04-02 DIAGNOSIS — J4520 Mild intermittent asthma, uncomplicated: Secondary | ICD-10-CM

## 2020-04-02 NOTE — Telephone Encounter (Signed)
Requested Prescriptions  Pending Prescriptions Disp Refills   metFORMIN (GLUCOPHAGE) 500 MG tablet [Pharmacy Med Name: metFORMIN HCl 500 MG Oral Tablet] 30 tablet 0    Sig: TAKE 1 TABLET BY MOUTH  DAILY WITH BREAKFAST , MUST HAVE OFFICE VISIT FOR REFILLS     Endocrinology:  Diabetes - Biguanides Passed - 04/02/2020  7:29 PM      Passed - Cr in normal range and within 360 days    Creatinine, Ser  Date Value Ref Range Status  02/09/2020 0.80 0.57 - 1.00 mg/dL Final         Passed - HBA1C is between 0 and 7.9 and within 180 days    HbA1c, POC (prediabetic range)  Date Value Ref Range Status  06/05/2019 6.3 5.7 - 6.4 % Final   Hgb A1c MFr Bld  Date Value Ref Range Status  12/26/2019 6.5 (H) 4.8 - 5.6 % Final    Comment:    (NOTE) Pre diabetes:          5.7%-6.4%  Diabetes:              >6.4%  Glycemic control for   <7.0% adults with diabetes          Passed - AA eGFR in normal range and within 360 days    GFR calc Af Amer  Date Value Ref Range Status  02/09/2020 94 >59 mL/min/1.73 Final    Comment:    **[facility] currently reports eGFR in compliance with the current**   recommendations of the [employer]. [facility] will   update reporting as new guidelines are published from the NKF-ASN   Task force.    GFR calc non Af Amer  Date Value Ref Range Status  02/09/2020 82 >59 mL/min/1.73 Final         Passed - Valid encounter within last 6 months    Recent Outpatient Visits          1 month ago Chest pain, unspecified type   [place], MD   9 months ago Elevated blood pressure reading   [place], Jarome Matin, RPH-CPP   10 months ago Prediabetes   [place], MD   1 year ago Acute pain of right knee   [place], Deborah B, MD   3 years ago Preop general physical exam   [place], Deborah B, MD      Future Appointments            In 2 weeks Kroeger, Daleen Snook M., PA-C [facility], [facility]

## 2020-04-05 ENCOUNTER — Other Ambulatory Visit: Payer: Self-pay

## 2020-04-05 ENCOUNTER — Ambulatory Visit
Admission: RE | Admit: 2020-04-05 | Discharge: 2020-04-05 | Disposition: A | Discharge status: Home / Self Care | Source: Ambulatory Visit | Attending: Family Medicine | Admitting: Family Medicine

## 2020-04-05 ENCOUNTER — Ambulatory Visit

## 2020-04-05 DIAGNOSIS — N644 Mastodynia: Secondary | ICD-10-CM

## 2020-04-19 ENCOUNTER — Ambulatory Visit (HOSPITAL_COMMUNITY): Attending: Internal Medicine

## 2020-04-19 ENCOUNTER — Other Ambulatory Visit: Payer: Self-pay

## 2020-04-19 DIAGNOSIS — R0600 Dyspnea, unspecified: Secondary | ICD-10-CM | POA: Insufficient documentation

## 2020-04-19 DIAGNOSIS — R002 Palpitations: Secondary | ICD-10-CM | POA: Insufficient documentation

## 2020-04-19 DIAGNOSIS — I351 Nonrheumatic aortic (valve) insufficiency: Secondary | ICD-10-CM | POA: Diagnosis not present

## 2020-04-19 DIAGNOSIS — R0789 Other chest pain: Secondary | ICD-10-CM | POA: Diagnosis present

## 2020-04-19 DIAGNOSIS — E669 Obesity, unspecified: Secondary | ICD-10-CM | POA: Insufficient documentation

## 2020-04-19 DIAGNOSIS — E119 Type 2 diabetes mellitus without complications: Secondary | ICD-10-CM | POA: Diagnosis not present

## 2020-04-19 LAB — ECHOCARDIOGRAM COMPLETE
Area-P 1/2: 3.13 cm2
S' Lateral: 2.9 cm

## 2020-04-22 ENCOUNTER — Ambulatory Visit: Admitting: Medical

## 2020-05-13 NOTE — Progress Notes (Signed)
Cardiology Office Note   Date:  05/14/2020   ID:  ANY MCNEICE, DOB [birth year], MRN [id number]  PCP:  Ladell Pier, MD  Cardiologist:  Quay Burow, MD EP: None  Chief Complaint  Patient presents with  . Follow-up    Chest pain      History of Present Illness: Lisa Galloway is a 59 y.o. female with PMMH of atypical chest pain, DM type 2, hiatal hernia, GERD, and obesity, who presents for follow-up of her chest pain.  She was last evaluated by cardiology at an outpatient visit with myself 02/13/20, at which time she reported ongoing sharp chest pain radiating to her right shoulder with associated flushing, sweating, and SOB. Also with occasional palpitations. Was very anxious, requesting additional cardiac work-up despite previous cardiac work-up including a coronary CTA 06/2019 which was without CAD and calcium score of 0, and an echocardiogram 06/2019 which showed EF 55-60%, no RWMA, and no significant valvular abnormalities. A repeat echocardiogram and 1 week zio patch monitor were ordered. Echo showed EF 60-65%, G1DD, no RWMA, mild LAE, and no significant valvular abnormalities. She has not completed the 1 week zio patch monitor.   She presents today for follow-up. She has been doing fairly well from a cardiac standpoint since her last visit. She notes improvement in her chest discomfort since her hiatal hernia surgery. She does still have some breast discomfort which she attributes to her fibrocystic breasts. She is up to date on her mammogram. No significant exertional chest pain. She does have DOE which is chronic and unchanged. She reports intermittent LE edema which generally self resolves within a couple days. She does report picking up from food frequently. She also has varicose veins. We talked about limiting salt intake and using compression stockings. She has continued to have RUQ pain and reports concern for an aneurysm or hernia in her stomach. She was  referred to GI at her last visit in September, however failed to follow-up. We again discussed the importance of following up with GI to further evaluate her pain. She would also like to switch primary cardiology providers to Dr. Oval Linsey.     Past Medical History:  Diagnosis Date  . Allergy    HEY FEVER  . Anemia   . Anginal pain ([place])   . Arthritis   . Asthma   . Dyspnea    On exertion  . GERD (gastroesophageal reflux disease)   . Hayfever   . Pre-diabetes     Past Surgical History:  Procedure Laterality Date  . ESOPHAGEAL MANOMETRY N/A 10/04/2019   Procedure: ESOPHAGEAL MANOMETRY (EM);  Surgeon: Mauri Pole, MD;  Location: [facility];  Service: Endoscopy;  Laterality: N/A;  . NO PAST SURGERIES    . XI ROBOTIC ASSISTED PARAESOPHAGEAL HERNIA REPAIR N/A 01/04/2020   Procedure: ROBOTIC REPAIR OF PARAESOPHAGEAL HIATAL HERNIA WITH FUNDOPLICATION WITH MESH, BILATERAL TAP BLOCK;  Surgeon: Michael Boston, MD;  Location: [facility];  Service: General;  Laterality: N/A;     Current Outpatient Medications  Medication Sig Dispense Refill  . albuterol (VENTOLIN HFA) 108 (90 Base) MCG/ACT inhaler INHALE 2 PUFFS BY MOUTH EVERY 6 HOURS AS NEEDED FOR WHEEZING OR SHORTNESS OF BREATH 18 g 2  . CRANBERRY PO Take 1 capsule by mouth daily.    . diclofenac Sodium (VOLTAREN) 1 % GEL Apply 2 g topically daily as needed (knee pain).    . famotidine (PEPCID) 40 MG tablet Take 1 tablet (40 mg total) by  mouth 2 (two) times daily. 60 tablet 5  . ferrous sulfate 325 (65 FE) MG tablet Take 325 mg by mouth daily with breakfast.    . metFORMIN (GLUCOPHAGE) 500 MG tablet TAKE 1 TABLET BY MOUTH  DAILY WITH BREAKFAST , MUST HAVE OFFICE VISIT FOR REFILLS 90 tablet 1  . Multiple Vitamin (MULTIVITAMIN WITH MINERALS) TABS tablet Take 1 tablet by mouth daily.    Marland Kitchen omeprazole (PRILOSEC) 40 MG capsule Take 1 capsule (40 mg total) by mouth 2 (two) times daily. 60 capsule 5  . ondansetron (ZOFRAN) 4 MG tablet Take 1  tablet (4 mg total) by mouth every 8 (eight) hours as needed for nausea. 8 tablet 5  . promethazine (PHENERGAN) 25 MG suppository Place 1 suppository (25 mg total) rectally every 6 (six) hours as needed for nausea. 5 suppository 5   No current facility-administered medications for this visit.    Allergies:   Ginger, Latex, and Oxycodone    Social History:  The patient  reports that she has never smoked. She has never used smokeless tobacco. She reports that she does not drink alcohol and does not use drugs.   Family History:  The patient's family history includes Alcoholism in her brother; Colon cancer in her maternal aunt and maternal grandmother; Diabetes in her brother, mother, sister, and sister; Hypertension in her brother, sister, and sister; Kidney disease in her mother; Other in her mother; Sleep apnea in an other family member.    ROS:  Please see the history of present illness.   Otherwise, review of systems are positive for none.   All other systems are reviewed and negative.    PHYSICAL EXAM: VS:  BP 118/76   Pulse 90   Ht 5\' 5"  (1.651 m)   Wt 208 lb 12.8 oz (94.7 kg)   BMI 34.75 kg/m  , BMI Body mass index is 34.75 kg/m. GEN: Well nourished, well developed, in no acute distress HEENT: sclera anciteric Neck: no JVD, carotid bruits, or masses Cardiac: RRR; no murmurs, rubs, or gallops, no edema; TTP of chest wall Respiratory:  clear to auscultation bilaterally, normal work of breathing GI: soft, obese, nondistended, + BS MS: no deformity or atrophy Skin: warm and dry, no rash Neuro:  Strength and sensation are intact Psych: anxious   EKG:  EKG is not ordered today.   Recent Labs: 06/05/2019: ALT 32 02/09/2020: BUN 10; Creatinine, Ser 0.80; Hemoglobin 11.0; Platelets 322; Potassium 4.2; Sodium 141; TSH 1.170    Lipid Panel    Component Value Date/Time   CHOL 157 06/05/2019 1111   TRIG 77 06/05/2019 1111   HDL 83 06/05/2019 1111   CHOLHDL 1.9 06/05/2019 1111    LDLCALC 59 06/05/2019 1111      Wt Readings from Last 3 Encounters:  05/14/20 208 lb 12.8 oz (94.7 kg)  02/13/20 209 lb 9.6 oz (95.1 kg)  02/09/20 213 lb (96.6 kg)      Other studies Reviewed: Additional studies/ records that were reviewed today include:   Coronary CTA 06/2019: IMPRESSION: 1. Coronary calcium score of 0. This was 0 percentile for age and sex matched control.  2. Normal coronary origin with right dominance.  3. CAD-RADS 0. No evidence of CAD (0%). Consider non-atherosclerotic causes of chest pain.  Echocardiogram 04/2020: 1. Left ventricular ejection fraction, by estimation, is 60 to 65%. The  left ventricle has normal function. The left ventricle has no regional  wall motion abnormalities. Left ventricular diastolic parameters are  consistent with  Grade I diastolic  dysfunction (impaired relaxation).  2. Right ventricular systolic function is normal. The right ventricular  size is normal.  3. Left atrial size was mildly dilated.  4. The mitral valve is normal in structure. No evidence of mitral valve  regurgitation. No evidence of mitral stenosis.  5. The aortic valve is tricuspid. Aortic valve regurgitation is trivial.  No aortic stenosis is present.  6. The inferior vena cava is normal in size with greater than 50%  respiratory variability, suggesting right atrial pressure of 3 mmHg.    ASSESSMENT AND PLAN:  1. Atypical chest pain: Very reassuring cardiac work-up 06/2019 including a Coronary CTA with a calcium score of 0 and no evidence of CAD and an echocardiogram 04/2020 with EF 60-65%, G1DD, no RWMA, and no significant valvular abnormalities. Doubtful this is cardiac in nature.  - Continue risk factor modifications including BP goal <130/80, LDL goal <100, and A1C goal <7  2. DM type 2: A1C 6.5 11/2019; at goal of <7 - Continue metformin per PCP  3. Hiatal hernia s/p repair 12/2019: Chest pain has improved - Will refer to GI for further  evaluation  4. GERD: recent Hpylori test was negative.  - Continue PPI BID  5. RUQ pain: this continues to be an issue. Referred to GI 01/2020, however has not followed-up.  - Will resubmit referral to GI for further evaluation    Current medicines are reviewed at length with the patient today.  The patient does not have concerns regarding medicines.  The following changes have been made:  As above  Labs/ tests ordered today include:  No orders of the defined types were placed in this encounter.    Disposition:   FU with Dr. Oval Linsey in 1 year  Signed, Abigail Butts, PA-C  05/14/2020 10:10 AM

## 2020-05-14 ENCOUNTER — Encounter: Payer: Self-pay | Admitting: Medical

## 2020-05-14 ENCOUNTER — Other Ambulatory Visit: Payer: Self-pay

## 2020-05-14 ENCOUNTER — Telehealth: Payer: Self-pay

## 2020-05-14 ENCOUNTER — Ambulatory Visit (INDEPENDENT_AMBULATORY_CARE_PROVIDER_SITE_OTHER): Admitting: Medical

## 2020-05-14 VITALS — BP 118/76 | HR 90 | Ht 65.0 in | Wt 208.8 lb

## 2020-05-14 DIAGNOSIS — R0789 Other chest pain: Secondary | ICD-10-CM

## 2020-05-14 DIAGNOSIS — K219 Gastro-esophageal reflux disease without esophagitis: Secondary | ICD-10-CM | POA: Diagnosis not present

## 2020-05-14 DIAGNOSIS — K440 Diaphragmatic hernia with obstruction, without gangrene: Secondary | ICD-10-CM | POA: Diagnosis not present

## 2020-05-14 DIAGNOSIS — E119 Type 2 diabetes mellitus without complications: Secondary | ICD-10-CM

## 2020-05-14 NOTE — Telephone Encounter (Signed)
Fine with me

## 2020-05-14 NOTE — Patient Instructions (Signed)
Medication Instructions:  Your physician recommends that you continue on your current medications as directed. Please refer to the Current Medication list given to you today.  *If you need a refill on your cardiac medications before your next appointment, please call your pharmacy*  Lab Work: NONE ordered at this time of appointment   If you have labs (blood work) drawn today and your tests are completely normal, you will receive your results only by: MyChart Message (if you have MyChart) OR A paper copy in the mail If you have any lab test that is abnormal or we need to change your treatment, we will call you to review the results.  Testing/Procedures: NONE ordered at this time of appointment   Follow-Up: At [facility], you and your health needs are our priority.  As part of our continuing mission to provide you with exceptional heart care, we have created designated Provider Care Teams.  These Care Teams include your primary Cardiologist (physician) and Advanced Practice Providers (APPs -  Physician Assistants and Nurse Practitioners) who all work together to provide you with the care you need, when you need it.  Your next appointment:   12 month(s)  The format for your next appointment:   In Person  Provider:   Jonathan Berry, MD  Other Instructions   

## 2020-05-14 NOTE — Telephone Encounter (Signed)
Patient would like to switch care from Dr. Quay Burow to Dr. Skeet Latch.

## 2020-05-16 NOTE — Telephone Encounter (Signed)
OK with me.

## 2020-05-17 ENCOUNTER — Ambulatory Visit: Admitting: Physician Assistant

## 2020-05-21 NOTE — Telephone Encounter (Signed)
Message sent to schedulers to arrange  

## 2020-06-27 ENCOUNTER — Other Ambulatory Visit: Payer: Self-pay

## 2020-06-27 ENCOUNTER — Ambulatory Visit (INDEPENDENT_AMBULATORY_CARE_PROVIDER_SITE_OTHER): Admitting: Physician Assistant

## 2020-06-27 ENCOUNTER — Encounter: Payer: Self-pay | Admitting: Physician Assistant

## 2020-06-27 ENCOUNTER — Other Ambulatory Visit (INDEPENDENT_AMBULATORY_CARE_PROVIDER_SITE_OTHER)

## 2020-06-27 VITALS — BP 110/76 | HR 77 | Ht 65.0 in | Wt 209.0 lb

## 2020-06-27 DIAGNOSIS — K219 Gastro-esophageal reflux disease without esophagitis: Secondary | ICD-10-CM

## 2020-06-27 DIAGNOSIS — R109 Unspecified abdominal pain: Secondary | ICD-10-CM

## 2020-06-27 LAB — BASIC METABOLIC PANEL
BUN: 13 mg/dL (ref 6–23)
CO2: 31 mEq/L (ref 19–32)
Calcium: 9.2 mg/dL (ref 8.4–10.5)
Chloride: 106 mEq/L (ref 96–112)
Creatinine, Ser: 0.83 mg/dL (ref 0.40–1.20)
GFR: 77.73 mL/min (ref >60.00)
Glucose, Bld: 110 mg/dL — ABNORMAL HIGH (ref 70–99)
Potassium: 3.7 mEq/L (ref 3.5–5.1)
Sodium: 140 mEq/L (ref 135–145)

## 2020-06-27 MED ORDER — HYOSCYAMINE SULFATE 0.125 MG SL SUBL: 0.1250 mg | SUBLINGUAL_TABLET | Freq: Four times a day (QID) | SUBLINGUAL | 4 refills | Status: DC | PRN

## 2020-06-27 NOTE — Patient Instructions (Addendum)
If you are age 59 or older, your body mass index should be between 23-30. Your Body mass index is 34.78 kg/m. If this is out of the aforementioned range listed, please consider follow up with your Primary Care Provider.  If you are age 64 or younger, your body mass index should be between 19-25. Your Body mass index is 34.78 kg/m. If this is out of the aformentioned range listed, please consider follow up with your Primary Care Provider.   Your provider has requested that you go to the basement level for lab work before leaving today. Press "B" on the elevator. The lab is located at the first door on the left as you exit the elevator.  You have been scheduled for a CT scan of the abdomen and pelvis at [facility] ([address]---this is in the same building as [facility]).   You are scheduled on 07/02/2020 at 10:30 am. You should arrive 15 minutes prior to your appointment time for registration. Please follow the written instructions below on the day of your exam:  WARNING: IF YOU ARE ALLERGIC TO IODINE/X-RAY DYE, PLEASE NOTIFY RADIOLOGY IMMEDIATELY AT [phone]! YOU WILL BE GIVEN A 13 HOUR PREMEDICATION PREP.  1) Do not eat or drink anything after 6:30 am (4 hours prior to your test) 2) You have been given 2 bottles of oral contrast to drink. The solution may taste better if refrigerated, but do NOT add ice or any other liquid to this solution. Shake well before drinking.    Drink 1 bottle of contrast @ 8:30 am (2 hours prior to your exam)  Drink 1 bottle of contrast @ 9:30 am (1 hour prior to your exam)  You may take any medications as prescribed with a small amount of water, if necessary. If you take any of the following medications: METFORMIN, GLUCOPHAGE, GLUCOVANCE, AVANDAMET, RIOMET, FORTAMET, ACTOPLUS MET, JANUMET, GLUMETZA or METAGLIP, you MAY be asked to HOLD this medication 48 hours AFTER the exam.  The purpose of you drinking the oral contrast is to aid  in the visualization of your intestinal tract. The contrast solution may cause some diarrhea. Depending on your individual set of symptoms, you may also receive an intravenous injection of x-ray contrast/dye. Plan on being at [facility] for 30 minutes or longer, depending on the type of exam you are having performed.  This test typically takes 30-45 minutes to complete.  If you have any questions regarding your exam or if you need to reschedule, you may call the CT department at [phone] between the hours of 8:00 am and 5:00 pm, Monday-Friday.  ________________________________________________________________________  Continue Omeprazole 40 mg 1 capsule twice daily. STOP Famotidine  START Hyoscyamine dissolve 1 tablet on the tongue every 6 hours as needed.  You will be due for your Colonoscopy in 2023.  We will contact you about you Ct results and discuss follow up at that time.  Thank you for entrusting me with your care and choosing [facility].  Amy Esterwood, PA-C 

## 2020-06-27 NOTE — Progress Notes (Addendum)
Subjective:    Patient ID: Lisa Galloway, female    DOB: 02-12-1961, 60 y.o.   MRN: [id number]  HPI Lisa Galloway is a pleasant 60 year old African-American female, established with Dr. Silverio Decamp, who comes in today for evaluation of recurrent right-sided abdominal pain. Patient is status post laparoscopic repair of incarcerated paraesophageal hernia in August 2021 per Dr. Johney Maine.. She says that the recurrent right-sided abdominal pain had been present intermittently prior to her surgery. She did have a previously known large hiatal hernia noted on EGD in 2017. He also has family history of colon cancer and several second-degree relatives and underwent colonoscopy in 2017 which was negative other than internal hemorrhoids.  She will be due for interval follow-up at 5 years. She says over the past year she has had the recurrent sharp right-sided pain which seems to be brought on at times by certain movements,  Stretching, or sometimes coughing. She says this pain will be abrupt in onset and fairly severe and usually only lasts for 5 to 10 minutes and then gradually resolves.  She says she has to hold her right side when this occurs and gets a fullness or knot-like sensation in that area each time this happens.  In between these episodes she is not having any abdominal pain and says this may occur once a week or so.  Appetite has been fine, weight has been stable, no association with bowel habits. She is worried that she has an aneurysm or some other issue internally in her abdomen that is causing these acute episodes.  Review of Systems Pertinent positive and negative review of systems were noted in the above HPI section.  All other review of systems was otherwise negative.  Outpatient Encounter Medications as of 06/27/2020  Medication Sig  . albuterol (VENTOLIN HFA) 108 (90 Base) MCG/ACT inhaler INHALE 2 PUFFS BY MOUTH EVERY 6 HOURS AS NEEDED FOR WHEEZING OR SHORTNESS OF BREATH  . CRANBERRY PO Take 1  capsule by mouth daily.  . diclofenac Sodium (VOLTAREN) 1 % GEL Apply 2 g topically daily as needed (knee pain).  . ferrous sulfate 325 (65 FE) MG tablet Take 325 mg by mouth daily with breakfast.  . hyoscyamine (LEVSIN SL) 0.125 MG SL tablet Place 1 tablet (0.125 mg total) under the tongue every 6 (six) hours as needed.  . metFORMIN (GLUCOPHAGE) 500 MG tablet TAKE 1 TABLET BY MOUTH  DAILY WITH BREAKFAST , MUST HAVE OFFICE VISIT FOR REFILLS  . Multiple Vitamin (MULTIVITAMIN WITH MINERALS) TABS tablet Take 1 tablet by mouth daily.  Marland Kitchen omeprazole (PRILOSEC) 40 MG capsule Take 1 capsule (40 mg total) by mouth 2 (two) times daily.  . ondansetron (ZOFRAN) 4 MG tablet Take 1 tablet (4 mg total) by mouth every 8 (eight) hours as needed for nausea.  . promethazine (PHENERGAN) 25 MG suppository Place 1 suppository (25 mg total) rectally every 6 (six) hours as needed for nausea.  . [DISCONTINUED] famotidine (PEPCID) 40 MG tablet Take 1 tablet (40 mg total) by mouth 2 (two) times daily.   No facility-administered encounter medications on file as of 06/27/2020.   Allergies  Allergen Reactions  . Ginger Hives    Possible reaction to ginger root  . Latex Itching  . Oxycodone Nausea And Vomiting and Other (See Comments)    Heart racing, flushed, "felt like a heart attack"   Patient Active Problem List   Diagnosis Date Noted  . Non-intractable vomiting   . Regurgitation and rechewing   . Cervical  high risk human papillomavirus (HPV) DNA test positive 08/31/2019  . Dermoid cyst 08/23/2019  . Fibroids 08/23/2019  . Atypical chest pain 06/09/2019  . Iron deficiency anemia 10/16/2016  . Incarcerated hiatal hernia s/p robotic repair 01/04/2020 10/12/2016  . Intermittent asthma 10/12/2016  . GERD without esophagitis 10/08/2016  . Prediabetes 10/08/2016  . Obesity (BMI 30-39.9) 10/08/2016   Social History   Socioeconomic History  . Marital status: Married    Spouse name: Not on file  . Number of  children: 2  . Years of education: Not on file  . Highest education level: Not on file  Occupational History  . Occupation: nurse  Tobacco Use  . Smoking status: Never Smoker  . Smokeless tobacco: Never Used  Vaping Use  . Vaping Use: Never used  Substance and Sexual Activity  . Alcohol use: No  . Drug use: No  . Sexual activity: Yes    Birth control/protection: None  Other Topics Concern  . Not on file  Social History Narrative  . Not on file   Social Determinants of Health   Financial Resource Strain: Not on file  Food Insecurity: Not on file  Transportation Needs: Not on file  Physical Activity: Not on file  Stress: Not on file  Social Connections: Not on file  Intimate Partner Violence: Not on file    Lisa Galloway's family history includes Alcoholism in her brother; Colon cancer in her maternal aunt and maternal grandmother; Diabetes in her brother, mother, sister, and sister; Hypertension in her brother, sister, and sister; Kidney disease in her mother; Other in her mother; Sleep apnea in an other family member.      Objective:    Vitals:   06/27/20 1046  BP: 110/76  Pulse: 77  SpO2: 98%    Physical Exam Well-developed well-nourished older AA female  in no acute distress.  Height, Weight, BMI 34.7  HEENT; nontraumatic normocephalic, EOMI, PE RR LA, sclera anicteric. Oropharynx; not examined Neck; supple, no JVD Cardiovascular; regular rate and rhythm with S1-S2, no murmur rub or gallop Pulmonary; Clear bilaterally Abdomen; soft, basically nontender, nondistended, no palpable mass or hepatosplenomegaly, bowel sounds are active.  No palpable abdominal wall hernia, no costal margin tenderness, laparoscopic incisional ports healed Rectal; not done today Skin; benign exam, no jaundice rash or appreciable lesions Extremities; no clubbing cyanosis or edema skin warm and dry Neuro/Psych; alert and oriented x4, grossly nonfocal mood and affect appropriate        Assessment & Plan:   #60 60 year old African-American female with 1 year history of intermittent episodic right mid abdominal pain which occurs abruptly usually associated with some movement stretching or coughing.  Pain is sharp severe and lasts 5 to 10 minutes and then gradually resolves and is associated with a fullness or knot sensation in the right mid abdomen. Symptoms are consistent with a abdominal wall/ventral hernia, symptomatic, though I cannot palpate on exam.  This may be secondary to bowel spasm, consider intussusception  #2 status post laparoscopic repair of paraesophageal hernia/incarcerated August 2021-stable #3 GERD-stable on twice daily PPI and twice daily famotidine, patient has not tried to decrease dosing status post repair of paraesophageal hernia  #4 family history of colon cancer several second-degree relatives-up-to-date with colon cancer surveillance and will be due for follow-up colonoscopy - fall 2023  Plan;  Schedule for CT of the abdomen and pelvis with contrast Trial of Levsin sublingual as needed for acute episodes of pain Discussed de-escalating her GERD regimen since she  is now status post repair of the incarcerated paraesophageal hernia. Stop famotidine Continue omeprazole 40 mg p.o. twice daily over the next 3 to 4 months and if doing well then decrease to 40 mg p.o. every morning. Follow-up Colonoscopy fall 2023 per Dr. Silverio Decamp Further recommendations pending findings at [facility].  Renata Gambino Genia Harold PA-C 06/27/2020   Cc: Ladell Pier, MD

## 2020-07-02 ENCOUNTER — Ambulatory Visit (HOSPITAL_COMMUNITY)

## 2020-07-09 ENCOUNTER — Ambulatory Visit (HOSPITAL_COMMUNITY)
Admission: RE | Admit: 2020-07-09 | Discharge: 2020-07-09 | Disposition: A | Discharge status: Home / Self Care | Source: Ambulatory Visit | Attending: Physician Assistant | Admitting: Physician Assistant

## 2020-07-09 ENCOUNTER — Other Ambulatory Visit: Payer: Self-pay

## 2020-07-09 DIAGNOSIS — R109 Unspecified abdominal pain: Secondary | ICD-10-CM | POA: Insufficient documentation

## 2020-07-09 DIAGNOSIS — K219 Gastro-esophageal reflux disease without esophagitis: Secondary | ICD-10-CM | POA: Insufficient documentation

## 2020-07-09 MED ORDER — IOHEXOL 300 MG/ML  SOLN
100.0000 mL | Freq: Once | INTRAMUSCULAR | Status: AC | PRN
  Administered 2020-07-09: 100 mL via INTRAVENOUS

## 2020-07-29 NOTE — Progress Notes (Signed)
Reviewed and agree with documentation and assessment and plan. K. Veena Nandigam , MD   

## 2020-09-12 NOTE — Telephone Encounter (Signed)
Monitor never returned and marked "lost" in the [facility] system. Order will be cancelled

## 2021-02-08 ENCOUNTER — Other Ambulatory Visit: Payer: Self-pay | Admitting: Internal Medicine

## 2021-02-08 DIAGNOSIS — R7303 Prediabetes: Secondary | ICD-10-CM

## 2021-02-09 NOTE — Telephone Encounter (Signed)
Requested medication (s) are due for refill today: yes  Requested medication (s) are on the active medication list: yes  Last refill:  04/02/20   Future visit scheduled: no  Notes to clinic:  called pt to set up OV- pt stated she will call back to schedule   Requested Prescriptions  Pending Prescriptions Disp Refills   metFORMIN (GLUCOPHAGE) 500 MG tablet [Pharmacy Med Name: metFORMIN HCl 500 MG Oral Tablet] 90 tablet 0    Sig: TAKE 1 TABLET BY MOUTH ONCE DAILY WITH BREAKFAST, MUST HAVE OFFICE VISIT FOR REFILLS     Endocrinology:  Diabetes - Biguanides Failed - 02/08/2021  6:27 PM      Failed - HBA1C is between 0 and 7.9 and within 180 days    HbA1c, POC (prediabetic range)  Date Value Ref Range Status  06/05/2019 6.3 5.7 - 6.4 % Final   Hgb A1c MFr Bld  Date Value Ref Range Status  12/26/2019 6.5 (H) 4.8 - 5.6 % Final    Comment:    (NOTE) Pre diabetes:          5.7%-6.4%  Diabetes:              >6.4%  Glycemic control for   <7.0% adults with diabetes           Failed - AA eGFR in normal range and within 360 days    GFR calc Af Amer  Date Value Ref Range Status  02/09/2020 94 >59 mL/min/1.73 Final    Comment:    **[facility] currently reports eGFR in compliance with the current**   recommendations of the [employer]. [facility] will   update reporting as new guidelines are published from the NKF-ASN   Task force.    GFR calc non Af Amer  Date Value Ref Range Status  02/09/2020 82 >59 mL/min/1.73 Final   GFR  Date Value Ref Range Status  06/27/2020 77.73 >60.00 mL/min Final    Comment:    Calculated using the CKD-EPI Creatinine Equation (2021)          Failed - Valid encounter within last 6 months    Recent Outpatient Visits           1 year ago Chest pain, unspecified type   [place], [place], MD   1 year ago Elevated blood pressure reading   [place], Jarome Matin, RPH-CPP   1 year ago Prediabetes   [place] Ladell Pier, MD   2 years ago Acute pain of right knee   [place] Ladell Pier, MD   4 years ago Preop general physical exam   [place] Ladell Pier, MD              Passed - Cr in normal range and within 360 days    Creatinine, Ser  Date Value Ref Range Status  06/27/2020 0.83 0.40 - 1.20 mg/dL Final

## 2021-02-19 ENCOUNTER — Ambulatory Visit: Attending: Internal Medicine

## 2021-02-19 ENCOUNTER — Other Ambulatory Visit: Payer: Self-pay

## 2021-02-19 DIAGNOSIS — Z111 Encounter for screening for respiratory tuberculosis: Secondary | ICD-10-CM | POA: Diagnosis not present

## 2021-02-19 NOTE — Progress Notes (Signed)
Tuberculin skin test applied to right ventral forearm.  Patient informed to schedule appt for nurse visit in 48-72 hours to have site read.  Jay'A R Michael Ventresca, RMA  

## 2021-02-21 ENCOUNTER — Ambulatory Visit

## 2021-02-24 ENCOUNTER — Ambulatory Visit

## 2021-02-25 ENCOUNTER — Telehealth: Payer: Self-pay | Admitting: Internal Medicine

## 2021-02-25 NOTE — Telephone Encounter (Signed)
Copied from [place] [id number]. Topic: Appointment Scheduling - Scheduling Inquiry for Clinic >> Feb 21, 2021  3:52 PM Bayard Beaver wrote: Reason for IOM:BTDHRCB needs to reschedule her TB READ test appt. Please call back  Left patient vm that is she has not received the TB Read she will need to schedule another TB Test firs then come back for the read.

## 2021-02-26 ENCOUNTER — Ambulatory Visit: Attending: Internal Medicine

## 2021-02-26 ENCOUNTER — Other Ambulatory Visit: Payer: Self-pay

## 2021-02-26 DIAGNOSIS — Z111 Encounter for screening for respiratory tuberculosis: Secondary | ICD-10-CM | POA: Diagnosis not present

## 2021-02-26 NOTE — Progress Notes (Signed)
Tuberculin skin test applied to left ventral forearm.  Patient informed to schedule appt for nurse visit in 48-72 hours to have site read.  Jackelyn Knife, RMA

## 2021-02-28 LAB — TB SKIN TEST
Induration: 0 mm
TB Skin Test: NEGATIVE

## 2021-05-01 ENCOUNTER — Encounter: Payer: Self-pay | Admitting: Gastroenterology

## 2021-06-11 ENCOUNTER — Other Ambulatory Visit: Payer: Self-pay | Admitting: Internal Medicine

## 2021-08-20 ENCOUNTER — Emergency Department (HOSPITAL_COMMUNITY)

## 2021-08-20 ENCOUNTER — Emergency Department (HOSPITAL_COMMUNITY)
Admission: EM | Admit: 2021-08-20 | Discharge: 2021-08-20 | Disposition: A | Discharge status: Home / Self Care | Attending: Emergency Medicine | Admitting: Emergency Medicine

## 2021-08-20 ENCOUNTER — Encounter (HOSPITAL_COMMUNITY): Payer: Self-pay

## 2021-08-20 ENCOUNTER — Other Ambulatory Visit: Payer: Self-pay

## 2021-08-20 DIAGNOSIS — R079 Chest pain, unspecified: Secondary | ICD-10-CM | POA: Insufficient documentation

## 2021-08-20 DIAGNOSIS — R030 Elevated blood-pressure reading, without diagnosis of hypertension: Secondary | ICD-10-CM | POA: Diagnosis not present

## 2021-08-20 DIAGNOSIS — Z91040 Latex allergy status: Secondary | ICD-10-CM | POA: Insufficient documentation

## 2021-08-20 DIAGNOSIS — R0602 Shortness of breath: Secondary | ICD-10-CM | POA: Diagnosis not present

## 2021-08-20 DIAGNOSIS — R200 Anesthesia of skin: Secondary | ICD-10-CM | POA: Diagnosis not present

## 2021-08-20 DIAGNOSIS — J45909 Unspecified asthma, uncomplicated: Secondary | ICD-10-CM | POA: Insufficient documentation

## 2021-08-20 LAB — CBC
HCT: 39 % (ref 36.0–46.0)
Hemoglobin: 12 g/dL (ref 12.0–15.0)
MCH: 25.1 pg — ABNORMAL LOW (ref 26.0–34.0)
MCHC: 30.8 g/dL (ref 30.0–36.0)
MCV: 81.4 fL (ref 80.0–100.0)
Platelets: 380 10*3/uL (ref 150–400)
RBC: 4.79 MIL/uL (ref 3.87–5.11)
RDW: 18.4 % — ABNORMAL HIGH (ref 11.5–15.5)
WBC: 8.8 10*3/uL (ref 4.0–10.5)
nRBC: 0 % (ref 0.0–0.2)

## 2021-08-20 LAB — BASIC METABOLIC PANEL
Anion gap: 7 (ref 5–15)
BUN: 16 mg/dL (ref 6–20)
CO2: 27 mmol/L (ref 22–32)
Calcium: 9.7 mg/dL (ref 8.9–10.3)
Chloride: 107 mmol/L (ref 98–111)
Creatinine, Ser: 0.94 mg/dL (ref 0.44–1.00)
GFR, Estimated: >60 mL/min (ref >60)
Glucose, Bld: 86 mg/dL (ref 70–99)
Potassium: 3.5 mmol/L (ref 3.5–5.1)
Sodium: 141 mmol/L (ref 135–145)

## 2021-08-20 LAB — TROPONIN I (HIGH SENSITIVITY)
Troponin I (High Sensitivity): 3 ng/L (ref <18)
Troponin I (High Sensitivity): 3 ng/L (ref <18)

## 2021-08-20 MED ORDER — KETOROLAC TROMETHAMINE 30 MG/ML IJ SOLN
15.0000 mg | Freq: Once | INTRAMUSCULAR | Status: AC
  Administered 2021-08-20: 15 mg via INTRAVENOUS
  Filled 2021-08-20: qty 1

## 2021-08-20 NOTE — ED Provider Triage Note (Signed)
Emergency Medicine Provider Triage Evaluation Note ? ?Lisa Galloway , a 61 y.o. female  was evaluated in triage.  Pt complains of central chest pain that started today.  She states it feels like a "pulsating" that radiates to her right arm and feels achy.  She also reports that she fell last when running with her dog and is having some right lower leg pain. She is also complaining of high blood pressure.  ? ?Review of Systems  ?Positive: Chest pain, right leg pain ?Negative: SOB, headache ? ?Physical Exam  ?BP (!) 127/91 (BP Location: Right Arm)   Pulse 92   Temp 98.8 ?F (37.1 ?C) (Oral)   Resp 16   Ht '5\' 5"'$  (1.651 m)   Wt 94.8 kg   SpO2 100%   BMI 34.78 kg/m?  ?Gen:   Awake, no distress   ?Resp:  Normal effort  ?MSK:   Moves extremities without difficulty  ?Other:   ? ?Medical Decision Making  ?Medically screening exam initiated at 6:38 PM.  Appropriate orders placed.  Lisa Galloway was informed that the remainder of the evaluation will be completed by another provider, this initial triage assessment does not replace that evaluation, and the importance of remaining in the ED until their evaluation is complete. ? ? ?  ?Lisa Plummer, PA-C ?08/20/21 1855 ? ?

## 2021-08-20 NOTE — ED Provider Notes (Signed)
?[facility] ?Provider Note ? ? ?CSN: [id number] ?Arrival date & time: 08/20/21  1816 ? ?  ? ?History ? ?Chief Complaint  ?Patient presents with  ? Chest Pain  ? ? ?Lisa Galloway is a 61 y.o. female.  She has a history of atypical chest pain and has been seen by cardiology before with numerous unremarkable tests.  She noticed she had some elevated blood pressures yesterday and today had some throbbing pain in her chest going down her right arm.  She said this is different than her regular chest pain.  She is seeing a new cardiologist but has not seen them yet.  She feels her shortness of breath is at baseline which she attributes to her asthma.  No nausea or vomiting.  She has tried nothing for her symptoms. ? ? ?Chest Pain ?Pain location:  R chest ?Pain quality: throbbing   ?Pain radiates to:  R arm ?Pain severity:  Moderate ?Onset quality:  Gradual ?Duration:  1 day ?Timing:  Constant ?Progression:  Unchanged ?Chronicity:  New ?Context: at rest   ?Relieved by:  None tried ?Worsened by:  Nothing ?Ineffective treatments:  None tried ?Associated symptoms: numbness and shortness of breath   ?Associated symptoms: no abdominal pain, no cough, no diaphoresis, no fever, no nausea, no vomiting and no weakness   ?Risk factors: no coronary artery disease, no prior DVT/PE and no smoking   ? ?  ? ?Home Medications ?Prior to Admission medications   ?Medication Sig Start Date End Date Taking? Authorizing Provider  ?albuterol (VENTOLIN HFA) 108 (90 Base) MCG/ACT inhaler INHALE 2 PUFFS BY MOUTH EVERY 6 HOURS AS NEEDED FOR WHEEZING OR SHORTNESS OF BREATH 09/18/19   Ladell Pier, MD  ?CRANBERRY PO Take 1 capsule by mouth daily.    [provider]  ?diclofenac Sodium (VOLTAREN) 1 % GEL Apply 2 g topically daily as needed (knee pain).    [provider]  ?ferrous sulfate 325 (65 FE) MG tablet Take 325 mg by mouth daily with breakfast.    [provider]   ?hyoscyamine (LEVSIN SL) 0.125 MG SL tablet Place 1 tablet (0.125 mg total) under the tongue every 6 (six) hours as needed. 06/27/20   [place], Amy S, PA-C  ?metFORMIN (GLUCOPHAGE) 500 MG tablet TAKE 1 TABLET BY MOUTH  DAILY WITH BREAKFAST , MUST HAVE OFFICE VISIT FOR REFILLS 04/02/20   Ladell Pier, MD  ?Multiple Vitamin (MULTIVITAMIN WITH MINERALS) TABS tablet Take 1 tablet by mouth daily.    [provider]  ?omeprazole (PRILOSEC) 40 MG capsule Take 1 capsule (40 mg total) by mouth 2 (two) times daily. 07/13/19   Levin Erp, PA  ?ondansetron (ZOFRAN) 4 MG tablet Take 1 tablet (4 mg total) by mouth every 8 (eight) hours as needed for nausea. 01/04/20    [place], MD  ?promethazine (PHENERGAN) 25 MG suppository Place 1 suppository (25 mg total) rectally every 6 (six) hours as needed for nausea. 01/04/20    [place], MD  ?   ? ?Allergies    ?Ginger, Latex, and Oxycodone   ? ?Review of Systems   ?Review of Systems  ?Constitutional:  Negative for diaphoresis and fever.  ?HENT:  Negative for sore throat.   ?Eyes:  Negative for visual disturbance.  ?Respiratory:  Positive for shortness of breath. Negative for cough.   ?Cardiovascular:  Positive for chest pain.  ?Gastrointestinal:  Negative for abdominal pain, nausea and vomiting.  ?Genitourinary:  Negative for dysuria.  ?  Musculoskeletal:  Negative for neck pain.  ?Skin:  Negative for rash.  ?Neurological:  Positive for numbness. Negative for weakness.  ? ?Physical Exam ?Updated Vital Signs ?BP (!) 144/88 (BP Location: Right Arm)   Pulse 73   Temp 98.8 ?F (37.1 ?C) (Oral)   Resp 16   Ht '5\' 5"'$  (1.651 m)   Wt 94.8 kg   SpO2 100%   BMI 34.78 kg/m?  ?Physical Exam ?Vitals and nursing note reviewed.  ?Constitutional:   ?   General: She is not in acute distress. ?   Appearance: She is well-developed.  ?HENT:  ?   Head: Normocephalic and atraumatic.  ?Eyes:  ?   Conjunctiva/sclera: Conjunctivae normal.  ?Cardiovascular:  ?   Rate and  Rhythm: Normal rate and regular rhythm.  ?   Heart sounds: Normal heart sounds. No murmur heard. ?Pulmonary:  ?   Effort: Pulmonary effort is normal. No respiratory distress.  ?   Breath sounds: Normal breath sounds.  ?Abdominal:  ?   Palpations: Abdomen is soft.  ?   Tenderness: There is no abdominal tenderness. There is no guarding or rebound.  ?Musculoskeletal:     ?   General: No swelling. Normal range of motion.  ?   Cervical back: Neck supple.  ?   Right lower leg: No tenderness. No edema.  ?   Left lower leg: No tenderness. No edema.  ?Skin: ?   General: Skin is warm and dry.  ?   Capillary Refill: Capillary refill takes less than 2 seconds.  ?Neurological:  ?   General: No focal deficit present.  ?   Mental Status: She is alert.  ?   Cranial Nerves: No cranial nerve deficit.  ?   Motor: No weakness.  ? ? ?ED Results / Procedures / Treatments   ?Labs ?(all labs ordered are listed, but only abnormal results are displayed) ?Labs Reviewed  ?CBC - Abnormal; Notable for the following components:  ?    Result Value  ? MCH 25.1 (*)   ? RDW 18.4 (*)   ? All other components within normal limits  ?BASIC METABOLIC PANEL  ?I-STAT BETA HCG BLOOD, ED ([facility], [facility], [facility] ONLY)  ?TROPONIN I (HIGH SENSITIVITY)  ?TROPONIN I (HIGH SENSITIVITY)  ? ? ?EKG ?EKG Interpretation ? ?Date/Time:  Wednesday August 20 2021 18:31:17 EDT ?Ventricular Rate:  88 ?PR Interval:  186 ?QRS Duration: 82 ?QT Interval:  366 ?QTC Calculation: 442 ?R Axis:   -22 ?Text Interpretation: Normal sinus rhythm Moderate voltage criteria for LVH, may be normal variant ( R in aVL , Cornell product ) Borderline ECG When compared with ECG of 09-Feb-2020 09:55, new t wave flattening lateral Confirmed by Aletta Edouard [phone]) on 08/20/2021 9:08:53 PM ? ?Radiology ?DG Chest 2 View ? ?Result Date: 08/20/2021 ?CLINICAL DATA:  Chest pain. EXAM: CHEST - 2 VIEW COMPARISON:  Chest x-ray 01/21/2020 FINDINGS: The heart size and mediastinal contours are within normal limits. Both  lungs are clear. The visualized skeletal structures are unremarkable. IMPRESSION: No active cardiopulmonary disease. Electronically Signed   By: Ronney Asters M.D.   On: 08/20/2021 19:24   ? ?Procedures ?Procedures  ? ? ?Medications Ordered in ED ?Medications  ?ketorolac (TORADOL) 30 MG/ML injection 15 mg (15 mg Intravenous Given 08/20/21 2228)  ? ? ?ED Course/ Medical Decision Making/ A&P ?  ?                        ?Medical Decision  Making ?Risk ?Prescription drug management. ? ?This patient complains of sided throbbing chest pain radiating into right arm; this involves an extensive number of treatment ?Options and is a complaint that carries with it a high risk of complications and ?morbidity. The differential includes ACS, pneumonia, pneumothorax, PE, vascular, anxiety, musculoskeletal, reflux ? ?I ordered, reviewed and interpreted labs, which included CBC with normal white count normal hemoglobin, chemistries normal, troponins flat ?I ordered medication IV Toradol with improvement in her symptoms and reviewed PMP when indicated. ?I ordered imaging studies which included chest x-ray and I independently ?   visualized and interpreted imaging which showed no acute findings ?Previous records obtained and reviewed in epic including prior cardiology notes.  They comment upon patient's atypical symptoms with no evidence of coronary disease ?Cardiac monitoring reviewed, normal sinus rhythm ?Social determinants considered, no significant barriers ?Critical Interventions: None ? ?After the interventions stated above, I reevaluated the patient and found patient be symptomatically improved ?Admission and further testing considered, no indications for admission or further work-up at this time.  Counseled patient to follow-up with her treating providers.  Continue current medications.  Return instructions discussed. ? ? ? ? ? ? ? ? ? ?Final Clinical Impression(s) / ED Diagnoses ?Final diagnoses:  ?Nonspecific chest pain   ? ? ?Rx / DC Orders ?ED Discharge Orders   ? ? None  ? ?  ? ? ?  ?Hayden Rasmussen, MD ?08/21/21 1159 ? ?

## 2021-08-20 NOTE — Discharge Instructions (Signed)
Seen in the emergency department for chest pain radiating to your right arm.  You had blood work including troponins, chest x-ray, EKG that did not show any significant findings.  Please continue your regular medications and follow-up with your primary care doctor and your cardiologist.  Return to the emergency department if any worsening or concerning symptoms ?

## 2021-08-20 NOTE — ED Triage Notes (Signed)
Pt reports central pulsating chest pain that started today that radiates to right arm "achy." She also reports that she fell last when running with her dog and c/o right lower leg pain. She is ambulatory. She is also concerned about having high blood pressure. BP 127/91 ?

## 2021-11-23 IMAGING — MG DIGITAL SCREENING BILAT W/ TOMO W/ CAD
6 of 10 series · 6 of 30 positions shown · non-contrast
Comparison: Previous exam(s).

CLINICAL DATA: Screening.

EXAM:
DIGITAL SCREENING BILATERAL MAMMOGRAM WITH TOMO AND CAD

[L XCCL synth-2D]
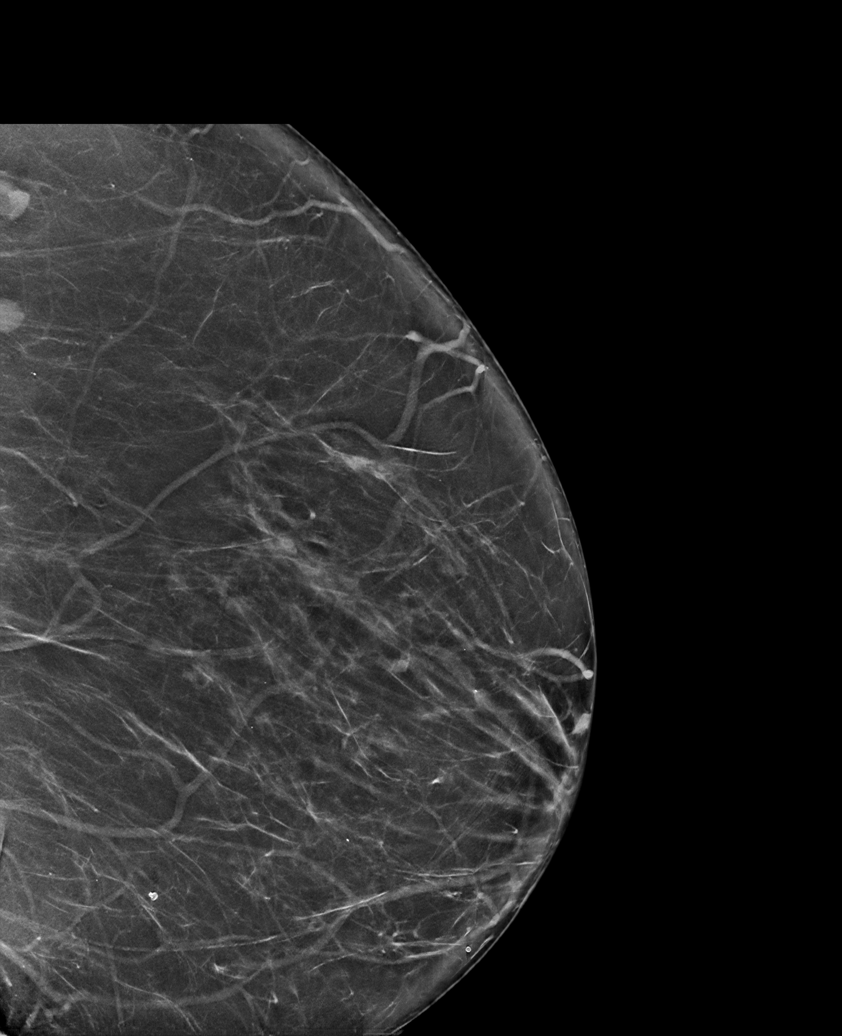

[R MLO synth-2D]
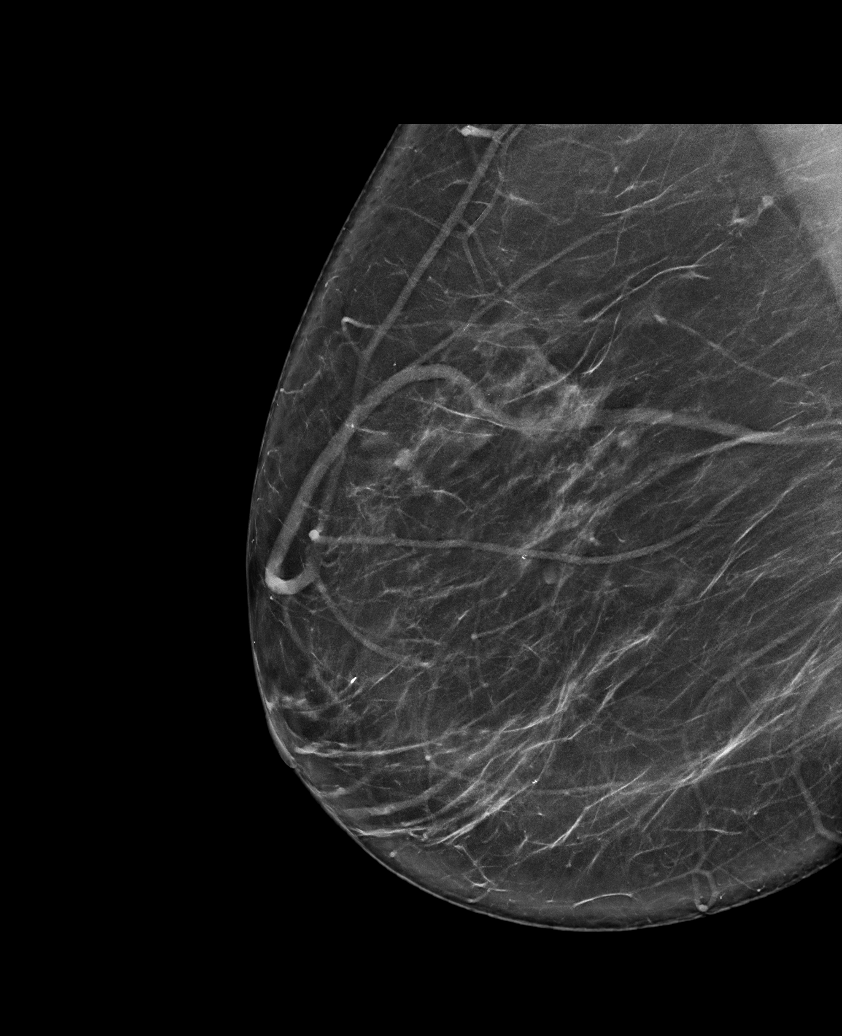

[L MLO synth-2D]
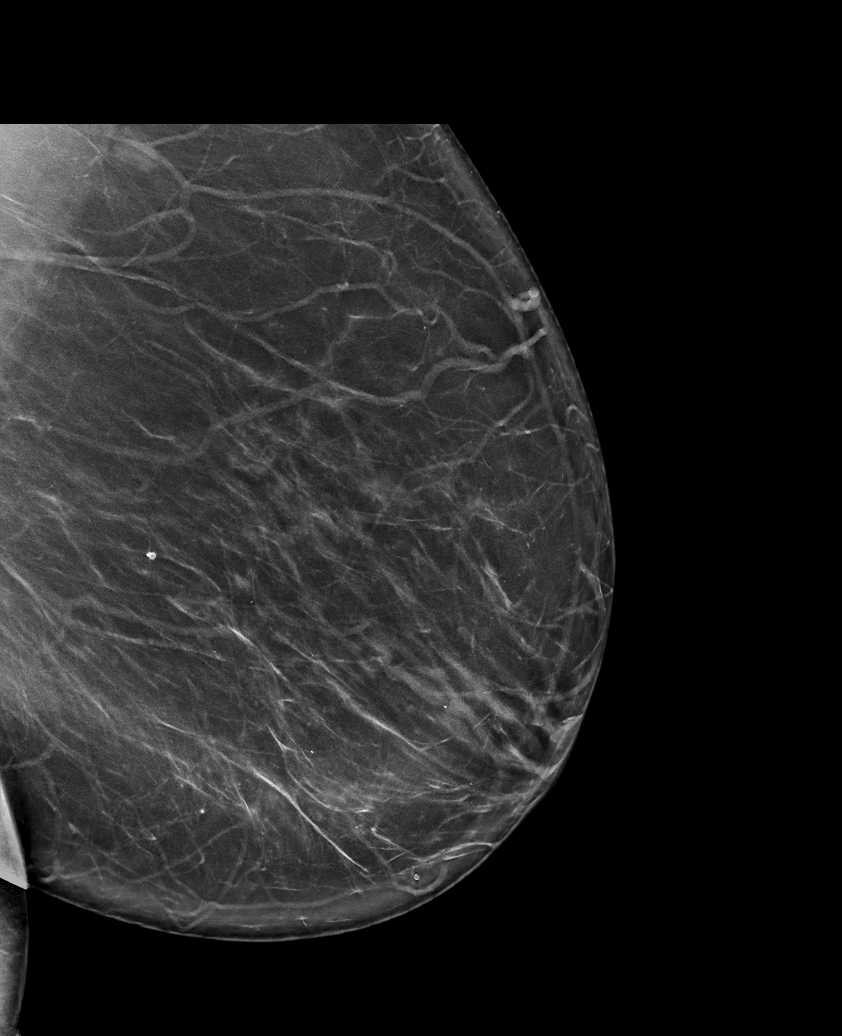

[R CC synth-2D]
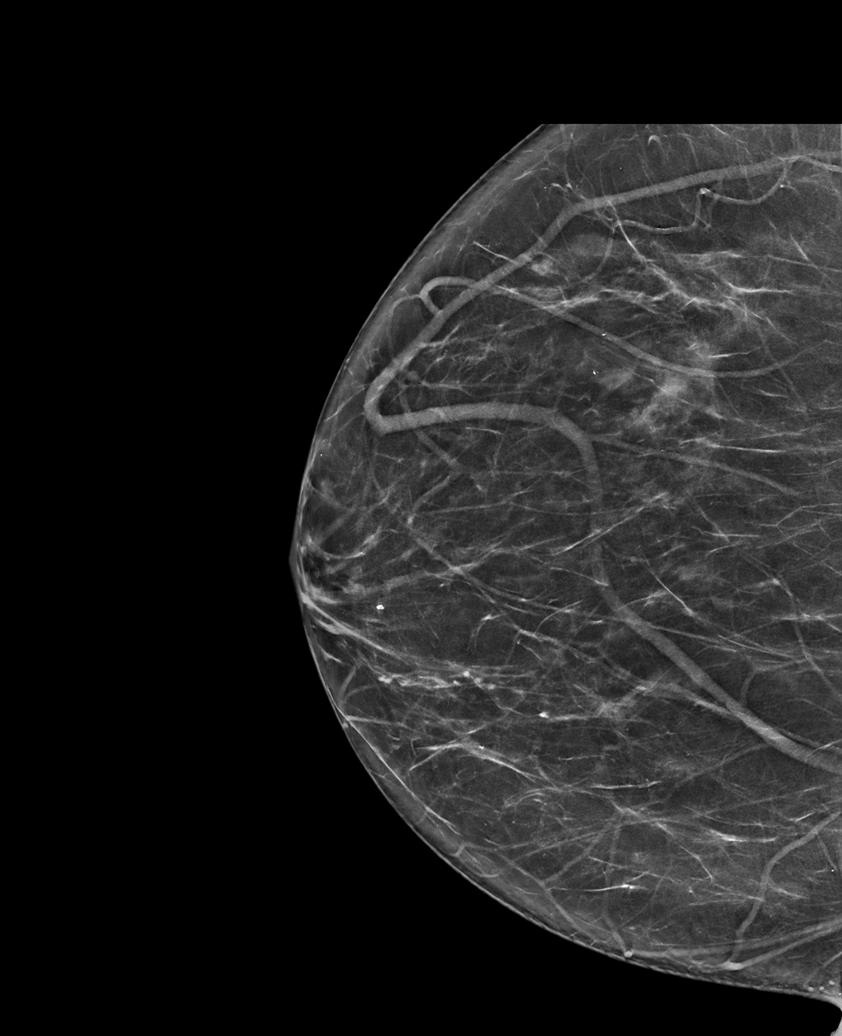

[L CC synth-2D]
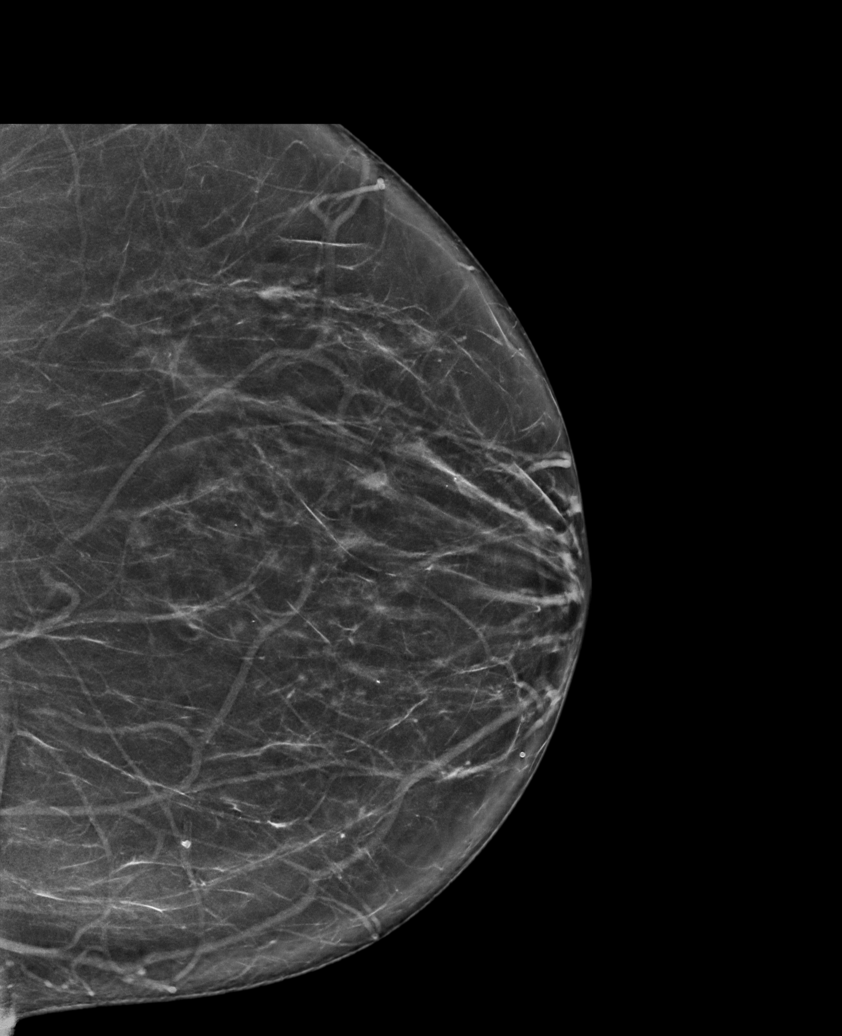

[R CC tomo · tomo slice 39/78.0]
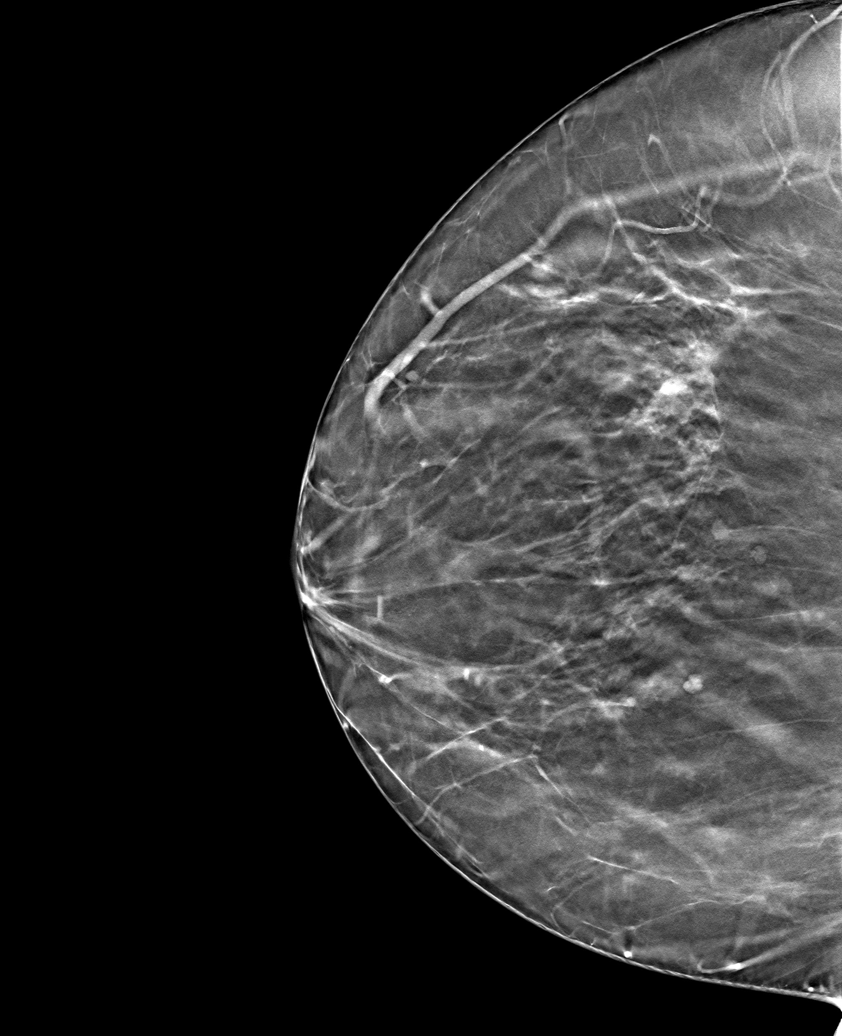

[6 of 30 positions shown; findings below may reference images not displayed]

ACR Breast Density Category b: There are scattered areas of
fibroglandular density.
FINDINGS: There are no findings suspicious for malignancy. Images were
processed with CAD.
IMPRESSION: No mammographic evidence of malignancy. A result letter of this
screening mammogram will be mailed directly to the patient.

RECOMMENDATION:
Screening mammogram in one year. (Code:CN-U-775)

BI-RADS CATEGORY  1: Negative.

## 2022-03-04 ENCOUNTER — Ambulatory Visit: Attending: Internal Medicine

## 2022-03-04 DIAGNOSIS — Z111 Encounter for screening for respiratory tuberculosis: Secondary | ICD-10-CM

## 2022-03-06 ENCOUNTER — Ambulatory Visit: Attending: Internal Medicine

## 2022-03-06 DIAGNOSIS — Z111 Encounter for screening for respiratory tuberculosis: Secondary | ICD-10-CM

## 2022-03-06 LAB — TB SKIN TEST
Induration: 0 mm
TB Skin Test: NEGATIVE

## 2022-03-06 NOTE — Progress Notes (Signed)
PPD reading was NEGATIVE. Pt provided with documentation.

## 2022-07-08 ENCOUNTER — Telehealth: Payer: Self-pay | Admitting: Cardiovascular Disease

## 2022-07-08 NOTE — Telephone Encounter (Signed)
Received call transferred as STAT.  Pt with complaints of chest pain she describes as a long term issue.  Pt states she is calling because the pain has been worsening over time.  She describes pain on the right side not radiating to her arm, jaw or back.  Pt denies nausea, shortness of breath, diaphoresis.  Chart reviewed pt has history of atypical chest pain.  Pt describes pain as "stickers, with some pressure."  Pt has appt. tomorrow with Laurann Montana NP.  Instructed pt if pain worsens, changes, is accompanied by nausea, shortness of breath or any new symptoms she should call 911. Encouraged patient to keep her appt. tomorrow, pt. Agreed. Georgana Curio MHA RN CCM

## 2022-07-08 NOTE — Telephone Encounter (Signed)
Pt c/o of Chest Pain: STAT if CP now or developed within 24 hours  1. Are you having CP right now? Yes  2. Are you experiencing any other symptoms (ex. SOB, nausea, vomiting, sweating)? No  3. How long have you been experiencing CP? 3 days   4. Is your CP continuous or coming and going? Coming and going   5. Have you taken Nitroglycerin? No  ?

## 2022-07-09 ENCOUNTER — Encounter (HOSPITAL_BASED_OUTPATIENT_CLINIC_OR_DEPARTMENT_OTHER): Payer: Self-pay | Admitting: Cardiology

## 2022-07-09 ENCOUNTER — Ambulatory Visit (INDEPENDENT_AMBULATORY_CARE_PROVIDER_SITE_OTHER): Admitting: Cardiology

## 2022-07-09 VITALS — BP 136/82 | HR 72 | Ht 64.0 in | Wt 224.3 lb

## 2022-07-09 DIAGNOSIS — R0789 Other chest pain: Secondary | ICD-10-CM

## 2022-07-09 DIAGNOSIS — R072 Precordial pain: Secondary | ICD-10-CM

## 2022-07-09 DIAGNOSIS — K219 Gastro-esophageal reflux disease without esophagitis: Secondary | ICD-10-CM

## 2022-07-09 DIAGNOSIS — R7303 Prediabetes: Secondary | ICD-10-CM

## 2022-07-09 MED ORDER — METOPROLOL TARTRATE 100 MG PO TABS: 100.0000 mg | ORAL_TABLET | Freq: Once | ORAL | 0 refills | Status: AC

## 2022-07-09 NOTE — Patient Instructions (Signed)
Medication Instructions:  Your Physician recommend you continue on your current medication as directed.    *If you need a refill on your cardiac medications before your next appointment, please call your pharmacy*   Lab Work: Your physician recommends that you return for lab work today-BMP  If you have labs (blood work) drawn today and your tests are completely normal, you will receive your results only by: MyChart Message (if you have MyChart) OR A paper copy in the mail If you have any lab test that is abnormal or we need to change your treatment, we will call you to review the results.   Testing/Procedures:   Your cardiac CT will be scheduled at one of the below locations:   [facility] [address] [place], [place] [address] [phone]  If scheduled at [facility], please arrive at the [facility] and Children's Entrance (Entrance C2) of [facility] 30 minutes prior to test start time. You can use the FREE valet parking offered at entrance C (encouraged to control the heart rate for the test)  Proceed to the [facility] Radiology Department (first floor) to check-in and test prep.  All radiology patients and guests should use entrance C2 at [facility], accessed from [facility], even though the hospital's physical address listed is [address]    Please follow these instructions carefully (unless otherwise directed):  Hold all erectile dysfunction medications at least 3 days (72 hrs) prior to test. (Ie viagra, cialis, sildenafil, tadalafil, etc) We will administer nitroglycerin during this exam.   On the Night Before the Test: Be sure to Drink plenty of water. Do not consume any caffeinated/decaffeinated beverages or chocolate 12 hours prior to your test. Do not take any antihistamines 12 hours prior to your test.  On the Day of the Test: Drink plenty of water until 1 hour prior to the test. Do not eat any  food 1 hour prior to test. You may take your regular medications prior to the test.  Take metoprolol (Lopressor) '100mg'$  two hours prior to test. FEMALES- please wear underwire-free bra if available, avoid dresses & tight clothing       After the Test: Drink plenty of water. After receiving IV contrast, you may experience a mild flushed feeling. This is normal. On occasion, you may experience a mild rash up to 24 hours after the test. This is not dangerous. If this occurs, you can take Benadryl 25 mg and increase your fluid intake. If you experience trouble breathing, this can be serious. If it is severe call 911 IMMEDIATELY. If it is mild, please call our office. If you take any of these medications: Glipizide/Metformin, Avandament, Glucavance, please do not take 48 hours after completing test unless otherwise instructed.  We will call to schedule your test 2-4 weeks out understanding that some insurance companies will need an authorization prior to the service being performed.   For non-scheduling related questions, please contact the cardiac imaging nurse navigator should you have any questions/concerns: Marchia Bond, Cardiac Imaging Nurse Navigator Gordy Clement, Cardiac Imaging Nurse Navigator [facility] Direct Office Dial: [phone]   For scheduling needs, including cancellations and rescheduling, please call [place], [phone].    Follow-Up: At [facility], you and your health needs are our priority.  As part of our continuing mission to provide you with exceptional heart care, we have created designated Provider Care Teams.  These Care Teams include your  primary Cardiologist (physician) and Advanced Practice Providers (APPs -  Physician Assistants and Nurse Practitioners) who all work together to provide you with the care you need, when you need it.  We recommend signing up for the patient portal called "MyChart".  Sign up information is  provided on this After Visit Summary.  MyChart is used to connect with patients for Virtual Visits (Telemedicine).  Patients are able to view lab/test results, encounter notes, upcoming appointments, etc.  Non-urgent messages can be sent to your provider as well.   To learn more about what you can do with MyChart, go to [email or web address].    Your next appointment:   6 week(s)  Provider:   Skeet Latch, MD or Laurann Montana, NP    Other Instructions Heart Healthy Diet Recommendations: A low-salt diet is recommended. Meats should be grilled, baked, or boiled. Avoid fried foods. Focus on lean protein sources like fish or chicken with vegetables and fruits. The American Heart Association is a Microbiologist!  American Heart Association Diet and Lifeystyle Recommendations   Exercise recommendations: The American Heart Association recommends 150 minutes of moderate intensity exercise weekly. Try 30 minutes of moderate intensity exercise 4-5 times per week. This could include walking, jogging, or swimming.

## 2022-07-09 NOTE — Progress Notes (Signed)
Office Visit    Patient Name: Lisa Galloway Date of Encounter: 07/09/2022  PCP:  Ladell Pier, MD   [place]  Cardiologist:  Skeet Latch, MD  Advanced Practice Provider:  No care team member to display Electrophysiologist:  None     Chief Complaint    Lisa Galloway is a 62 y.o. female presents today for chest pain.  Past Medical History    Past Medical History:  Diagnosis Date   Allergy    HEY FEVER   Anemia    Anginal pain (HCC)    Arthritis    Asthma    Dyspnea    On exertion   GERD (gastroesophageal reflux disease)    Hayfever    Pre-diabetes    Past Surgical History:  Procedure Laterality Date   ESOPHAGEAL MANOMETRY N/A 10/04/2019   Procedure: ESOPHAGEAL MANOMETRY (EM);  Surgeon: Mauri Pole, MD;  Location: [facility] ENDOSCOPY;  Service: Endoscopy;  Laterality: N/A;   XI ROBOTIC ASSISTED PARAESOPHAGEAL HERNIA REPAIR N/A 01/04/2020   Procedure: ROBOTIC REPAIR OF PARAESOPHAGEAL HIATAL HERNIA WITH FUNDOPLICATION WITH MESH, BILATERAL TAP BLOCK;  Surgeon: Michael Boston, MD;  Location: [facility] ORS;  Service: General;  Laterality: N/A;   Allergies  Allergies  Allergen Reactions   Ginger Hives    Possible reaction to ginger root   Latex Itching   Oxycodone Nausea And Vomiting and Other (See Comments)    Heart racing, flushed, "felt like a heart attack"    History of Present Illness    Lisa Galloway is a 62 y.o. female with a hx of atypical chest pain, Dm2, varicose veins, hiatal hernia, GERD, obesity last seen 05/14/20 by Roby Lofts, PA.  Prior CT 06/2019 calcium score of 0. Echo 06/2019 echo LVEF 55-60%, gr1DD, no significant valvular abnormalities. ZIO previously ordered but not worn. At last visit she was doing well and chest pain improved since hiatal hernia surgery.   Evaluated in the ED on 08/20/21 for chest pain, troponins flat, EKG NSR.   She contacted the office 07/08/21 noting intermittent chest pain and  was scheduled for office visit.   She presents today d/t an episode of chest pain the previous day. States it occurred when she woke up, while she was trying to adjust herself in bed she felt pain across her chest radiating to her back and down her arms. Pain was sharp "like stickers", and worsened when she used her arms to push up. The pain persisted most of the day, however, she was able to clean her house without any further concerns. She has been hesitant to return to her PCP for fear of being diagnosed with DM. She is concerned she could have "something growing in my chest and it will be too late". She is concerned this could be lasting effects of COVID. She did take tylenol and it alleviated the pain somewhat. She denies chest pain, palpitations, dyspnea, pnd, orthopnea, n, v, dizziness, syncope, edema, weight gain, or early satiety.    EKGs/Labs/Other Studies Reviewed:   The following studies were reviewed today:  04/19/20 echo complete - EF 60-65%, grade I DD, LA mildly dilated.  06/28/19 CTA - 1. Coronary calcium score of 0. This was 0 percentile for age and sex matched control.   2. Normal coronary origin with right dominance.   3. CAD-RADS 0. No evidence of CAD (0%). Consider non-atherosclerotic causes of chest pain.   EKG:  EKG is ordered today.  The ekg ordered  today demonstrates NSR, HR 72 bpm, consistent with previous EKG.   Recent Labs: 08/20/2021: BUN 16; Creatinine, Ser 0.94; Hemoglobin 12.0; Platelets 380; Potassium 3.5; Sodium 141  Recent Lipid Panel    Component Value Date/Time   CHOL 157 06/05/2019 1111   TRIG 77 06/05/2019 1111   HDL 83 06/05/2019 1111   CHOLHDL 1.9 06/05/2019 1111   LDLCALC 59 06/05/2019 1111    Home Medications   Current Meds  Medication Sig   albuterol (VENTOLIN HFA) 108 (90 Base) MCG/ACT inhaler INHALE 2 PUFFS BY MOUTH EVERY 6 HOURS AS NEEDED FOR WHEEZING OR SHORTNESS OF BREATH (Patient taking differently: Inhale 1 puff into the lungs  every 6 (six) hours as needed for shortness of breath.)   CRANBERRY PO Take 1 capsule by mouth daily.   diclofenac Sodium (VOLTAREN) 1 % GEL Apply 2 g topically daily as needed (knee pain).   ferrous sulfate 325 (65 FE) MG tablet Take 325 mg by mouth daily with breakfast.   metFORMIN (GLUCOPHAGE) 500 MG tablet TAKE 1 TABLET BY MOUTH  DAILY WITH BREAKFAST , MUST HAVE OFFICE VISIT FOR REFILLS   metoprolol tartrate (LOPRESSOR) 100 MG tablet Take 1 tablet (100 mg total) by mouth once for 1 dose. Take 1 tablet 2 hours prior to Cardiac CT   Multiple Vitamin (MULTIVITAMIN WITH MINERALS) TABS tablet Take 1 tablet by mouth daily.     Review of Systems    All other systems reviewed and are otherwise negative except as noted above.  Physical Exam    VS:  BP 136/82 (BP Location: Left Arm, Patient Position: Sitting, Cuff Size: Large)   Pulse 72   Ht '5\' 4"'$  (1.626 m)   Wt 224 lb 4.8 oz (101.7 kg)   BMI 38.50 kg/m  , BMI Body mass index is 38.5 kg/m.  Wt Readings from Last 3 Encounters:  07/09/22 224 lb 4.8 oz (101.7 kg)  08/20/21 209 lb (94.8 kg)  06/27/20 209 lb (94.8 kg)     GEN: Well nourished, well developed, in no acute distress. HEENT: normal. Neck: Supple, no JVD, carotid bruits, or masses. Cardiac: RRR, no murmurs, rubs, or gallops. No clubbing, cyanosis, edema.  Radials/PT 2+ and equal bilaterally.  Respiratory:  Respirations regular and unlabored, clear to auscultation bilaterally. GI: Soft, nontender, nondistended. MS: No deformity or atrophy. Skin: Warm and dry, no rash. Neuro:  Strength and sensation are intact. Psych: Normal affect.  Assessment & Plan    Atypical chest pain - EKG showed NSR, HR 72 bpm, chest pain has mixed features, but sounds to be musculoskeletal in nature. She has had several incidents of chest pain that have led her to the ED. Repeat coronary CTA, check BMET. If results are negative, discussed that she needs to follow up with her PCP for further  evaluation.   DM2  - "pre-diabetic" per patient, she has not seen her PCP in some time as she is afraid of being labeled as a diabetic. Encouraged her to follow up with PCP.   Hiatal hernia s/p repair 12/2019 - This relieved her episodes of CP somewhat.   GERD - Currently not taking anything for this, does not bother her anymore.   Disposition - BMET, coronary CTA, if negative, she needs to follow up with her PCP. Return in 6 weeks.        Disposition: Follow up in 6 week(s) with Skeet Latch, MD or APP.  Signed, Venia Carbon, NP 07/09/2022, 4:46 PM [place]

## 2022-07-10 LAB — BASIC METABOLIC PANEL WITH GFR
BUN/Creatinine Ratio: 13 (ref 12–28)
BUN: 13 mg/dL (ref 8–27)
CO2: 23 mmol/L (ref 20–29)
Calcium: 9.4 mg/dL (ref 8.7–10.3)
Chloride: 105 mmol/L (ref 96–106)
Creatinine, Ser: 1.01 mg/dL — ABNORMAL HIGH (ref 0.57–1.00)
Glucose: 85 mg/dL (ref 70–99)
Potassium: 4.4 mmol/L (ref 3.5–5.2)
Sodium: 142 mmol/L (ref 134–144)
eGFR: 64 mL/min/1.73

## 2022-07-15 ENCOUNTER — Telehealth (HOSPITAL_COMMUNITY): Payer: Self-pay | Admitting: *Deleted

## 2022-07-15 NOTE — Telephone Encounter (Signed)
Reaching out to patient to offer assistance regarding upcoming cardiac imaging study; pt verbalizes understanding of appt date/time, parking situation and where to check in, pre-test NPO status and medications ordered, and verified current allergies; name and call back number provided for further questions should they arise  Lisa Clement RN Navigator Cardiac [place] [facility] [phone] office [phone] cell  Patient to take 168m metoprolol tartrate two hours prior to her cardiac CT scan. She is aware to arrive at 10am.

## 2022-07-16 ENCOUNTER — Ambulatory Visit (HOSPITAL_COMMUNITY)
Admission: RE | Admit: 2022-07-16 | Discharge: 2022-07-16 | Disposition: A | Discharge status: Home / Self Care | Source: Ambulatory Visit | Attending: Cardiology | Admitting: Cardiology

## 2022-07-16 DIAGNOSIS — R072 Precordial pain: Secondary | ICD-10-CM | POA: Diagnosis not present

## 2022-07-16 MED ORDER — IOHEXOL 350 MG/ML SOLN
100.0000 mL | Freq: Once | INTRAVENOUS | Status: AC | PRN
  Administered 2022-07-16: 100 mL via INTRAVENOUS

## 2022-07-16 MED ORDER — NITROGLYCERIN 0.4 MG SL SUBL: 0.8000 mg | SUBLINGUAL_TABLET | Freq: Once | SUBLINGUAL | Status: AC

## 2022-07-16 MED ORDER — NITROGLYCERIN 0.4 MG SL SUBL
SUBLINGUAL_TABLET | SUBLINGUAL | Status: AC
Start: 2022-07-16 — End: 2022-07-16
  Administered 2022-07-16: 0.8 mg via SUBLINGUAL
  Filled 2022-07-16: qty 2

## 2022-08-17 ENCOUNTER — Ambulatory Visit (HOSPITAL_BASED_OUTPATIENT_CLINIC_OR_DEPARTMENT_OTHER): Admitting: Family

## 2022-11-02 ENCOUNTER — Other Ambulatory Visit: Payer: Self-pay

## 2022-11-02 ENCOUNTER — Emergency Department (HOSPITAL_COMMUNITY)
Admission: EM | Admit: 2022-11-02 | Discharge: 2022-11-02 | Disposition: A | Discharge status: Home / Self Care | Attending: Emergency Medicine | Admitting: Emergency Medicine

## 2022-11-02 ENCOUNTER — Emergency Department (HOSPITAL_COMMUNITY)

## 2022-11-02 DIAGNOSIS — W010XXA Fall on same level from slipping, tripping and stumbling without subsequent striking against object, initial encounter: Secondary | ICD-10-CM | POA: Insufficient documentation

## 2022-11-02 DIAGNOSIS — S82141A Displaced bicondylar fracture of right tibia, initial encounter for closed fracture: Secondary | ICD-10-CM

## 2022-11-02 DIAGNOSIS — S82191A Other fracture of upper end of right tibia, initial encounter for closed fracture: Secondary | ICD-10-CM | POA: Insufficient documentation

## 2022-11-02 DIAGNOSIS — Y92009 Unspecified place in unspecified non-institutional (private) residence as the place of occurrence of the external cause: Secondary | ICD-10-CM | POA: Diagnosis not present

## 2022-11-02 DIAGNOSIS — Z91040 Latex allergy status: Secondary | ICD-10-CM | POA: Diagnosis not present

## 2022-11-02 DIAGNOSIS — S8991XA Unspecified injury of right lower leg, initial encounter: Secondary | ICD-10-CM | POA: Diagnosis present

## 2022-11-02 MED ORDER — HYDROCODONE-ACETAMINOPHEN 5-325 MG PO TABS
1.0000 | ORAL_TABLET | Freq: Once | ORAL | Status: AC
  Administered 2022-11-02: 1 via ORAL
  Filled 2022-11-02: qty 1

## 2022-11-02 MED ORDER — HYDROCODONE-ACETAMINOPHEN 5-325 MG PO TABS: 1.0000 | ORAL_TABLET | Freq: Three times a day (TID) | ORAL | 0 refills | Status: AC | PRN

## 2022-11-02 NOTE — Progress Notes (Signed)
Orthopedic Tech Progress Note Patient Details:  FRONNIE SCHEULEN 10-11-1960 [id number]  Applied knee immobilizer to patient tried to get patient up to use crutches after I showed her how to NWB as well as WBAT. Neither one worked patient had used crutches years ago with a hurt foot injury. And was scared since then. Went and told MD what was going on. I reached out to PT to see if they could help but PT had went home by then   Ortho Devices Type of Ortho Device: Knee Immobilizer Ortho Device/Splint Location: RLE Ortho Device/Splint Interventions: Ordered, Application, Adjustment   Post Interventions Patient Tolerated: Well, Fair Instructions Provided: Care of device  Donald Pore 11/02/2022, 6:08 PM

## 2022-11-02 NOTE — Discharge Instructions (Signed)
You are seen in the ER for your knee pain.  You have evidence of a very mild fracture right beneath your knee area.  We recommend that you keep your leg in a knee immobilizer at all times.  No weightbearing allowed, therefore you will have to use crutches with movement. Pain medications have been prescribed to be used as needed. Call the orthopedic doctor for a follow-up in 1 week. Keep the leg elevated at all times that is possible.

## 2022-11-02 NOTE — ED Notes (Signed)
Dc instructions reviewed with pt. PT verbalized understanding. Pt DC.  °

## 2022-11-02 NOTE — ED Provider Notes (Signed)
[place] EMERGENCY DEPARTMENT AT [facility] Provider Note   CSN: [id number] Arrival date & time: 11/02/22  1316     History  Chief Complaint  Patient presents with   Leg Injury    Lisa Galloway is a 62 y.o. female.  HPI77 year old female comes in with chief complaint of mechanical fall.  Patient states that yesterday when she returned home, her dog tackled her.  The dog hit her knee, her knee buckled and she fell.  She had significant pain immediately and was unable to bear weight.  This morning she woke up with significant swelling around her left knee, and last night she could not sleep well because of pain.  Pain is persistent and she is unable to bear weight.  Home Medications Prior to Admission medications   Medication Sig Start Date End Date Taking? Authorizing Provider  HYDROcodone-acetaminophen (NORCO/VICODIN) 5-325 MG tablet Take 1 tablet by mouth every 8 (eight) hours as needed for up to 5 days for severe pain. 11/02/22 11/07/22 Yes Katerin Negrete, MD  albuterol (VENTOLIN HFA) 108 (90 Base) MCG/ACT inhaler INHALE 2 PUFFS BY MOUTH EVERY 6 HOURS AS NEEDED FOR WHEEZING OR SHORTNESS OF BREATH Patient taking differently: Inhale 1 puff into the lungs every 6 (six) hours as needed for shortness of breath. 09/18/19   Marcine Matar, MD  CRANBERRY PO Take 1 capsule by mouth daily.    [provider]  diclofenac Sodium (VOLTAREN) 1 % GEL Apply 2 g topically daily as needed (knee pain).    [provider]  ferrous sulfate 325 (65 FE) MG tablet Take 325 mg by mouth daily with breakfast.    [provider]  metFORMIN (GLUCOPHAGE) 500 MG tablet TAKE 1 TABLET BY MOUTH  DAILY WITH BREAKFAST , MUST HAVE OFFICE VISIT FOR REFILLS 04/02/20   Marcine Matar, MD  metoprolol tartrate (LOPRESSOR) 100 MG tablet Take 1 tablet (100 mg total) by mouth once for 1 dose. Take 1 tablet 2 hours prior to Cardiac CT 07/09/22 07/09/22  Flossie Dibble, NP  Multiple  Vitamin (MULTIVITAMIN WITH MINERALS) TABS tablet Take 1 tablet by mouth daily.    [provider]      Allergies    Ginger, Latex, and Oxycodone    Review of Systems   Review of Systems  All other systems reviewed and are negative.   Physical Exam Updated Vital Signs BP 119/89   Pulse 70   Temp 98.9 F (37.2 C) (Oral)   Resp 16   SpO2 100%  Physical Exam Vitals and nursing note reviewed.  Constitutional:      Appearance: She is well-developed.  HENT:     Head: Normocephalic and atraumatic.  Eyes:     Extraocular Movements: Extraocular movements intact.  Cardiovascular:     Rate and Rhythm: Normal rate.  Pulmonary:     Effort: Pulmonary effort is normal.  Musculoskeletal:     Cervical back: Normal range of motion and neck supple.     Comments: Patient has clear left-sided knee effusion along with tenderness over the patella and the proximal tibial region.  No gross deformity.  No hip tenderness. Compartments are soft, patient has 2+ distal pulse of the left lower extremity and skin is warm to touch.  Skin:    General: Skin is dry.  Neurological:     Mental Status: She is alert and oriented to person, place, and time.     ED Results / Procedures / Treatments  Labs (all labs ordered are listed, but only abnormal results are displayed) Labs Reviewed - No data to display  EKG None  Radiology CT KNEE LEFT WO CONTRAST  Result Date: 11/02/2022 CLINICAL DATA:  Knee trauma.  Tibial plateau fracture. EXAM: CT OF THE LEFT KNEE WITHOUT CONTRAST TECHNIQUE: Multidetector CT imaging of the left knee was performed according to the standard protocol. Multiplanar CT image reconstructions were also generated. RADIATION DOSE REDUCTION: This exam was performed according to the departmental dose-optimization program which includes automated exposure control, adjustment of the mA and/or kV according to patient size and/or use of iterative reconstruction technique. COMPARISON:   Left knee radiographs 11/02/2022 FINDINGS: Bones/Joint/Cartilage Minimally depressed mildly comminuted fracture of the lateral tibial plateau with about 3-1/2 mm cortical depression. No metaphyseal or medial extension. Tiny displaced loose body. Underlying degenerative changes with tricompartment joint space narrowing and osteophyte formation. Osteochondral defect in the medial tibial plateau appears to be degenerative rather than posttraumatic. Degenerative changes in the patellofemoral joint. Large effusion. Ligaments Suboptimally assessed by CT. Muscles and Tendons No acute abnormality suggested. Soft tissues No abnormal soft tissue mass or collection. IMPRESSION: 1. Minimally depressed fracture of the lateral tibial plateau. 2. 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 3. Osteochondral defect in the medial tibial plateau. 4. Large left knee effusion. Electronically Signed   By: Burman Nieves M.D.   On: 11/02/2022 17:10   DG Knee Complete 4 Views Left  Result Date: 11/02/2022 CLINICAL DATA:  Severe pain in the left thigh and knee after being knocked down by a large dog EXAM: LEFT KNEE - COMPLETE 4+ VIEW; LEFT TIBIA AND FIBULA - 2 VIEW; LEFT FEMUR 2 VIEWS COMPARISON:  CT abdomen and pelvis 07/09/2020 FINDINGS: No acute femur fracture or dislocation. There is a large knee joint effusion. Loss of cortical definition with associated curvilinear lucencies and depression of the lateral tibial plateau compatible with fracture. No dislocation. No fracture of the distal tibia or fibula. Soft tissues are unremarkable. IMPRESSION: 1. Mildly depressed lateral tibial plateau fracture. 2. Large knee joint effusion. Electronically Signed   By: Minerva Fester M.D.   On: 11/02/2022 15:15   DG Tibia/Fibula Left  Result Date: 11/02/2022 CLINICAL DATA:  Severe pain in the left thigh and knee after being knocked down by a large dog EXAM: LEFT KNEE - COMPLETE 4+ VIEW; LEFT TIBIA AND FIBULA - 2  VIEW; LEFT FEMUR 2 VIEWS COMPARISON:  CT abdomen and pelvis 07/09/2020 FINDINGS: No acute femur fracture or dislocation. There is a large knee joint effusion. Loss of cortical definition with associated curvilinear lucencies and depression of the lateral tibial plateau compatible with fracture. No dislocation. No fracture of the distal tibia or fibula. Soft tissues are unremarkable. IMPRESSION: 1. Mildly depressed lateral tibial plateau fracture. 2. Large knee joint effusion. Electronically Signed   By: Minerva Fester M.D.   On: 11/02/2022 15:15   DG FEMUR MIN 2 VIEWS LEFT  Result Date: 11/02/2022 CLINICAL DATA:  Severe pain in the left thigh and knee after being knocked down by a large dog EXAM: LEFT KNEE - COMPLETE 4+ VIEW; LEFT TIBIA AND FIBULA - 2 VIEW; LEFT FEMUR 2 VIEWS COMPARISON:  CT abdomen and pelvis 07/09/2020 FINDINGS: No acute femur fracture or dislocation. There is a large knee joint effusion. Loss of cortical definition with associated curvilinear lucencies and depression of the lateral tibial plateau compatible with fracture. No dislocation. No fracture of the distal tibia or fibula. Soft tissues are unremarkable. IMPRESSION:  1. Mildly depressed lateral tibial plateau fracture. 2. Large knee joint effusion. Electronically Signed   By: Minerva Fester M.D.   On: 11/02/2022 15:15    Procedures Procedures    Medications Ordered in ED Medications  HYDROcodone-acetaminophen (NORCO/VICODIN) 5-325 MG per tablet 1 tablet (1 tablet Oral Given 11/02/22 1612)    ED Course/ Medical Decision Making/ A&P                             Medical Decision Making Amount and/or Complexity of Data Reviewed Radiology: ordered.  Risk Prescription drug management.  This patient presents to the ED with chief complaint(s) of left-sided leg pain with pertinent past medical history of blunt trauma to the leg yesterday followed by leg buckling and fall.  The differential diagnosis includes  Knee strain,  knee effusion, hemarthrosis, soft tissue injury, meniscal tear, occult knee fracture.  X-ray of the knees have been ordered, independently interpreted them.  There appears to be evidence of depression over the tibial plateau.  On repeat exam, patient has tenderness over the tibial plateau region.  Consultation: - Consulted or discussed management/test interpretation with external professional: Orthopedic surgery to discuss tibial plateau fracture to see if they wanted to admit the patient. Dale [place] from orthopedic service has seen the patient.  He recommends knee immobilizer and close follow-up in 1 week.  Patient has no evidence of compartment syndrome.  Results of the ER workup discussed with the patient.  RICE treatment recommended.  Nonweightbearing status confirmed.    Final Clinical Impression(s) / ED Diagnoses Final diagnoses:  Closed fracture of right tibial plateau, initial encounter    Rx / DC Orders ED Discharge Orders          Ordered    HYDROcodone-acetaminophen (NORCO/VICODIN) 5-325 MG tablet  Every 8 hours PRN        11/02/22 1608              Derwood Kaplan, MD 11/02/22 1913

## 2022-11-02 NOTE — ED Triage Notes (Signed)
Pt was knocked down by her large dog last night and is having severe pain to L thigh and knee. Denies hitting head or LOC. Trying Tylenol arthritis at home without relief.

## 2023-03-09 ENCOUNTER — Ambulatory Visit: Attending: Internal Medicine

## 2023-03-09 DIAGNOSIS — Z111 Encounter for screening for respiratory tuberculosis: Secondary | ICD-10-CM | POA: Diagnosis not present

## 2023-03-09 NOTE — Progress Notes (Signed)
PPD Placement note Lisa Galloway, 62 y.o. female is here today for placement of PPD test Reason for PPD test: WORK CLEARENCE Pt taken PPD test before: yes Is patient taking any oral or IV steroid medication now or have they taken it in the last month? no Has the patient ever received the BCG vaccine?: no Has the patient been in recent contact with anyone known or suspected of having active TB disease?: no      Patient's Country of origin?: [place] O: Alert and oriented in NAD. P:  PPD placed on 03/09/2023.  Patient advised to return for reading within 48-72 hours.

## 2023-03-12 ENCOUNTER — Ambulatory Visit: Attending: Internal Medicine

## 2023-03-12 DIAGNOSIS — Z111 Encounter for screening for respiratory tuberculosis: Secondary | ICD-10-CM | POA: Diagnosis not present

## 2023-03-12 LAB — TB SKIN TEST
Induration: 0 mm
TB Skin Test: NEGATIVE

## 2023-03-12 NOTE — Progress Notes (Signed)
PPD Reading Note  PPD read and results entered in EpicCare.  Result: 0 mm induration.  Interpretation: negative  Allergic reaction: no

## 2023-05-19 ENCOUNTER — Other Ambulatory Visit: Payer: Self-pay

## 2023-05-19 ENCOUNTER — Emergency Department (HOSPITAL_COMMUNITY)
Admission: EM | Admit: 2023-05-19 | Discharge: 2023-05-19 | Disposition: A | Discharge status: Home / Self Care | Attending: Emergency Medicine | Admitting: Emergency Medicine

## 2023-05-19 ENCOUNTER — Encounter (HOSPITAL_COMMUNITY): Payer: Self-pay | Admitting: Emergency Medicine

## 2023-05-19 DIAGNOSIS — Z91040 Latex allergy status: Secondary | ICD-10-CM | POA: Insufficient documentation

## 2023-05-19 DIAGNOSIS — K0889 Other specified disorders of teeth and supporting structures: Secondary | ICD-10-CM | POA: Insufficient documentation

## 2023-05-19 MED ORDER — HYDROCODONE-ACETAMINOPHEN 5-325 MG PO TABS
2.0000 | ORAL_TABLET | Freq: Once | ORAL | Status: AC
  Administered 2023-05-19: 2 via ORAL
  Filled 2023-05-19: qty 2

## 2023-05-19 MED ORDER — AMOXICILLIN 500 MG PO CAPS: 500.0000 mg | ORAL_CAPSULE | Freq: Three times a day (TID) | ORAL | 0 refills | Status: DC

## 2023-05-19 NOTE — ED Triage Notes (Signed)
Pt reports breaking 3 teeth while eating pistachios 3 weeks ago.  Last night was eating and felt additional pieces of tooth break off.  Pt has pain radiating up her face on the right.  No n/v or fevers.

## 2023-05-19 NOTE — Discharge Instructions (Signed)
Please take 600 mg of ibuprofen every 6 hours as needed for pain.  Take the antibiotics as prescribed.  I provided a resource for some dentist you can call.  Please return to the emergency department for any worsening symptoms.

## 2023-05-19 NOTE — ED Provider Notes (Signed)
[place] EMERGENCY DEPARTMENT AT [facility] Provider Note   CSN: [id number] Arrival date & time: 05/19/23  1540     History Chief Complaint  Patient presents with   Dental Pain    Lisa Galloway is a 62 y.o. female patient who presents to the emerged apartment today with dental pain that is been ongoing for last 3 weeks but worse really in the last 24 to 48 hours.  Patient has initially had some broken teeth on the left lower gumline but then started broke another tooth on the right lower gumline which is causing her significant amount of pain.  She endorses right-sided facial swelling but denies fever, chills, oral swelling, trouble breathing, trouble swallowing.   Dental Pain      Home Medications Prior to Admission medications   Medication Sig Start Date End Date Taking? Authorizing Provider  amoxicillin (AMOXIL) 500 MG capsule Take 1 capsule (500 mg total) by mouth 3 (three) times daily. 05/19/23  Yes Liat Mayol M, PA-C  albuterol (VENTOLIN HFA) 108 (90 Base) MCG/ACT inhaler INHALE 2 PUFFS BY MOUTH EVERY 6 HOURS AS NEEDED FOR WHEEZING OR SHORTNESS OF BREATH Patient taking differently: Inhale 1 puff into the lungs every 6 (six) hours as needed for shortness of breath. 09/18/19   Marcine Matar, MD  CRANBERRY PO Take 1 capsule by mouth daily.    [provider]  diclofenac Sodium (VOLTAREN) 1 % GEL Apply 2 g topically daily as needed (knee pain).    [provider]  ferrous sulfate 325 (65 FE) MG tablet Take 325 mg by mouth daily with breakfast.    [provider]  metFORMIN (GLUCOPHAGE) 500 MG tablet TAKE 1 TABLET BY MOUTH  DAILY WITH BREAKFAST , MUST HAVE OFFICE VISIT FOR REFILLS 04/02/20   Marcine Matar, MD  metoprolol tartrate (LOPRESSOR) 100 MG tablet Take 1 tablet (100 mg total) by mouth once for 1 dose. Take 1 tablet 2 hours prior to Cardiac CT 07/09/22 07/09/22  Flossie Dibble, NP  Multiple Vitamin (MULTIVITAMIN WITH  MINERALS) TABS tablet Take 1 tablet by mouth daily.    [provider]      Allergies    Ginger, Latex, and Oxycodone    Review of Systems   Review of Systems  All other systems reviewed and are negative.   Physical Exam Updated Vital Signs BP 122/81   Pulse 73   Temp 98.1 F (36.7 C)   Resp 17   SpO2 100%  Physical Exam Vitals and nursing note reviewed.  Constitutional:      Appearance: Normal appearance.  HENT:     Head: Normocephalic and atraumatic.     Mouth/Throat:     Comments: Numerous dental caries and avulsed teeth.  No swelling of the oral floor.  No posterior pharyngeal erythema.  Uvula is midline.  Tongue appears normal. Eyes:     General:        Right eye: No discharge.        Left eye: No discharge.     Conjunctiva/sclera: Conjunctivae normal.  Pulmonary:     Effort: Pulmonary effort is normal.  Skin:    General: Skin is warm and dry.     Findings: No rash.  Neurological:     General: No focal deficit present.     Mental Status: She is alert.  Psychiatric:        Mood and Affect: Mood normal.        Behavior:  Behavior normal.     ED Results / Procedures / Treatments   Labs (all labs ordered are listed, but only abnormal results are displayed) Labs Reviewed - No data to display  EKG None  Radiology No results found.  Procedures Procedures    Medications Ordered in ED Medications  HYDROcodone-acetaminophen (NORCO/VICODIN) 5-325 MG per tablet 2 tablet (2 tablets Oral Given 05/19/23 1639)    ED Course/ Medical Decision Making/ A&P   {   Click here for ABCD2, HEART and other calculators  Medical Decision Making Lisa Galloway is a 62 y.o. female patient who presents to the emergency department today for further evaluation of dental pain.  No signs of Ludwig's angina.  No signs of RPA or PTA.  There is no evidence of drainable abscess.  Speech is normal and there is no acute distress.  No respiratory distress.  Will treat  conservatively with some Vicodin here and plan to discharge her home with antibiotics and resources for dentist.  Patient agreeable with plan.  Strict turn precautions were discussed.  She is safe for discharge.   Risk Prescription drug management.    Final Clinical Impression(s) / ED Diagnoses Final diagnoses:  Pain, dental    Rx / DC Orders ED Discharge Orders          Ordered    amoxicillin (AMOXIL) 500 MG capsule  3 times daily        05/19/23 1639              Honor Loh [place], [place] 05/19/23 1642    Anders Simmonds T, DO 05/21/23 1714

## 2023-06-08 ENCOUNTER — Ambulatory Visit: Attending: Family Medicine

## 2023-06-08 DIAGNOSIS — Z23 Encounter for immunization: Secondary | ICD-10-CM | POA: Insufficient documentation

## 2023-06-08 NOTE — Progress Notes (Addendum)
Flu vaccine administered in left deltoid per protocols.  Information sheet given. Patient denies and pain or discomfort at injection site. Tolerated injection well no reaction.

## 2023-06-15 ENCOUNTER — Ambulatory Visit: Attending: Family Medicine

## 2023-06-15 DIAGNOSIS — Z111 Encounter for screening for respiratory tuberculosis: Secondary | ICD-10-CM | POA: Diagnosis not present

## 2023-06-15 DIAGNOSIS — Z23 Encounter for immunization: Secondary | ICD-10-CM

## 2023-06-15 NOTE — Progress Notes (Signed)
 PPD Placement note Suzen JONETTA Rummer, 63 y.o. female is here today for placement of PPD test Reason for PPD test: work Pt taken PPD test before: yes Has the patient been in recent contact with anyone known or suspected of having active TB disease?: no      Patient's Country of origin?: [place]  P:  PPD placed on 06/15/2023.  Patient advised to return for reading within 48-72 hours.

## 2023-06-17 ENCOUNTER — Ambulatory Visit: Attending: Family Medicine

## 2023-06-17 DIAGNOSIS — Z111 Encounter for screening for respiratory tuberculosis: Secondary | ICD-10-CM

## 2023-06-17 LAB — TB SKIN TEST
Induration: 3 mm
TB Skin Test: NEGATIVE

## 2023-06-17 NOTE — Progress Notes (Signed)
PPD Reading Note  PPD read and results entered in EpicCare.  Result: 0 mm induration.  Interpretation: negative  Allergic reaction: no

## 2023-08-25 ENCOUNTER — Encounter (HOSPITAL_COMMUNITY): Payer: Self-pay | Admitting: Emergency Medicine

## 2023-08-25 ENCOUNTER — Other Ambulatory Visit: Payer: Self-pay

## 2023-08-25 ENCOUNTER — Emergency Department (HOSPITAL_COMMUNITY)
Admission: EM | Admit: 2023-08-25 | Discharge: 2023-08-25 | Disposition: A | Discharge status: Home / Self Care | Attending: Emergency Medicine | Admitting: Emergency Medicine

## 2023-08-25 DIAGNOSIS — Z91040 Latex allergy status: Secondary | ICD-10-CM | POA: Diagnosis not present

## 2023-08-25 DIAGNOSIS — Z7984 Long term (current) use of oral hypoglycemic drugs: Secondary | ICD-10-CM | POA: Insufficient documentation

## 2023-08-25 DIAGNOSIS — K0889 Other specified disorders of teeth and supporting structures: Secondary | ICD-10-CM | POA: Diagnosis present

## 2023-08-25 DIAGNOSIS — K029 Dental caries, unspecified: Secondary | ICD-10-CM | POA: Insufficient documentation

## 2023-08-25 DIAGNOSIS — J45909 Unspecified asthma, uncomplicated: Secondary | ICD-10-CM | POA: Insufficient documentation

## 2023-08-25 MED ORDER — HYDROCODONE-ACETAMINOPHEN 5-325 MG PO TABS
1.0000 | ORAL_TABLET | Freq: Once | ORAL | Status: AC
  Administered 2023-08-25: 1 via ORAL
  Filled 2023-08-25: qty 1

## 2023-08-25 MED ORDER — IBUPROFEN 800 MG PO TABS
800.0000 mg | ORAL_TABLET | Freq: Once | ORAL | Status: AC
  Administered 2023-08-25: 800 mg via ORAL
  Filled 2023-08-25: qty 1

## 2023-08-25 MED ORDER — AMOXICILLIN 500 MG PO CAPS: 500.0000 mg | ORAL_CAPSULE | Freq: Three times a day (TID) | ORAL | 0 refills | Status: AC

## 2023-08-25 MED ORDER — AMOXICILLIN 500 MG PO CAPS
500.0000 mg | ORAL_CAPSULE | Freq: Once | ORAL | Status: AC
  Administered 2023-08-25: 500 mg via ORAL
  Filled 2023-08-25: qty 1

## 2023-08-25 NOTE — ED Notes (Signed)
Pt verbalized understanding of discharge instructions. Pt ambulated from ed with steady gait.  

## 2023-08-25 NOTE — ED Provider Notes (Cosign Needed Addendum)
 [place] EMERGENCY DEPARTMENT AT [facility] Provider Note   CSN: [id number] Arrival date & time: 08/25/23  4098     History  Chief Complaint  Patient presents with   Dental Pain   HPI Lisa Galloway is a 63 y.o. female presenting for dental pain.  Started 4 days ago.  Located in the left lower posterior mouth.  Endorses cavities in that area.  Denies any oozing or bleeding.  Denies any radiation of pain into the neck or up into her face.  Denies fever.  States she has had similar pain on the right side of her mouth when she had a prior dental infection.  Past Medical History:  Diagnosis Date   Allergy    HEY FEVER   Anemia    Anginal pain (HCC)    Arthritis    Asthma    Dyspnea    On exertion   GERD (gastroesophageal reflux disease)    Hayfever    Pre-diabetes      Dental Pain      Home Medications Prior to Admission medications   Medication Sig Start Date End Date Taking? Authorizing Provider  albuterol (VENTOLIN HFA) 108 (90 Base) MCG/ACT inhaler INHALE 2 PUFFS BY MOUTH EVERY 6 HOURS AS NEEDED FOR WHEEZING OR SHORTNESS OF BREATH Patient taking differently: Inhale 1 puff into the lungs every 6 (six) hours as needed for shortness of breath. 09/18/19   Marcine Matar, MD  amoxicillin (AMOXIL) 500 MG capsule Take 1 capsule (500 mg total) by mouth 3 (three) times daily. 05/19/23   Meredeth Ide, Conner M, PA-C  CRANBERRY PO Take 1 capsule by mouth daily.    [provider]  diclofenac Sodium (VOLTAREN) 1 % GEL Apply 2 g topically daily as needed (knee pain).    [provider]  ferrous sulfate 325 (65 FE) MG tablet Take 325 mg by mouth daily with breakfast.    [provider]  metFORMIN (GLUCOPHAGE) 500 MG tablet TAKE 1 TABLET BY MOUTH  DAILY WITH BREAKFAST , MUST HAVE OFFICE VISIT FOR REFILLS 04/02/20   Marcine Matar, MD  metoprolol tartrate (LOPRESSOR) 100 MG tablet Take 1 tablet (100 mg total) by mouth once for 1 dose. Take  1 tablet 2 hours prior to Cardiac CT 07/09/22 07/09/22  Flossie Dibble, NP  Multiple Vitamin (MULTIVITAMIN WITH MINERALS) TABS tablet Take 1 tablet by mouth daily.    [provider]      Allergies    Ginger, Latex, and Oxycodone    Review of Systems   Review of Systems  Physical Exam Updated Vital Signs BP (!) 150/92   Pulse 72   Temp 97.9 F (36.6 C) (Oral)   Resp 18   Wt 101.7 kg   SpO2 98%   BMI 38.49 kg/m  Physical Exam Constitutional:      Appearance: Normal appearance.  HENT:     Head: Normocephalic.     Nose: Nose normal.     Mouth/Throat:     Tongue: No lesions. Tongue does not deviate from midline.     Palate: No mass and lesions.      Comments: Soft palate is nonedematous this Eyes:     Conjunctiva/sclera: Conjunctivae normal.  Neck:     Comments: No facial swelling noted. Pulmonary:     Effort: Pulmonary effort is normal.  Musculoskeletal:     Cervical back: Full passive range of motion without pain, normal range of motion and neck supple. Normal range  of motion.  Lymphadenopathy:     Cervical: No cervical adenopathy.  Neurological:     Mental Status: She is alert.  Psychiatric:        Mood and Affect: Mood normal.     ED Results / Procedures / Treatments   Labs (all labs ordered are listed, but only abnormal results are displayed) Labs Reviewed - No data to display  EKG None  Radiology No results found.  Procedures Procedures    Medications Ordered in ED Medications  HYDROcodone-acetaminophen (NORCO/VICODIN) 5-325 MG per tablet 1 tablet (has no administration in time range)  ibuprofen (ADVIL) tablet 800 mg (has no administration in time range)  amoxicillin (AMOXIL) capsule 500 mg (has no administration in time range)    ED Course/ Medical Decision Making/ A&P                                 Medical Decision Making Risk Prescription drug management.   63 year old well-appearing female presenting for dental pain.  Exam  findings consistent with possible dental infection.  Likely apical infection. Considered other deep space abscess or infection but unlikely given lack of symptoms and exam findings.  Also she looks well, nontoxic, in no acute distress and Stable.  Started on amoxicillin for dental infection.  Treat her pain with ibuprofen and Norco.  Advised Tylenol ibuprofen and applying ice at home for pain. Sent the rest of her amoxicillin to her pharmacy.  Discussed return precautions.  Advised to follow-up with a dentist.  Discharged good condition.        Final Clinical Impression(s) / ED Diagnoses Final diagnoses:  Pain, dental    Rx / DC Orders ED Discharge Orders     None         Gareth Eagle, PA-C 08/25/23 0719    Gareth Eagle, PA-C 08/25/23 6045    Linwood Dibbles, MD 08/26/23 [phone]

## 2023-08-25 NOTE — Discharge Instructions (Addendum)
 Evaluation today revealed that you likely have another dental infection.  I am starting on amoxicillin which is an antibiotic to treat the infection.  Please follow-up with a dentist.  If you have any trouble swallowing, cannot tolerate your secretions, trouble breathing, fever or any other concerning symptom please return to emergency department further evaluation.  Recommend Tylenol and ibuprofen along with applying ice for pain at home.

## 2023-08-25 NOTE — ED Triage Notes (Signed)
 Pt in with L lower mouth dental pain, has known cavities

## 2023-11-07 ENCOUNTER — Emergency Department (HOSPITAL_COMMUNITY)
Admission: EM | Admit: 2023-11-07 | Discharge: 2023-11-07 | Discharge status: Against Medical Advice | Attending: Emergency Medicine | Admitting: Emergency Medicine

## 2023-11-07 ENCOUNTER — Encounter (HOSPITAL_COMMUNITY): Payer: Self-pay | Admitting: *Deleted

## 2023-11-07 ENCOUNTER — Other Ambulatory Visit: Payer: Self-pay

## 2023-11-07 DIAGNOSIS — W458XXA Other foreign body or object entering through skin, initial encounter: Secondary | ICD-10-CM | POA: Insufficient documentation

## 2023-11-07 DIAGNOSIS — Z5321 Procedure and treatment not carried out due to patient leaving prior to being seen by health care provider: Secondary | ICD-10-CM | POA: Diagnosis not present

## 2023-11-07 DIAGNOSIS — S60455A Superficial foreign body of left ring finger, initial encounter: Secondary | ICD-10-CM | POA: Diagnosis present

## 2023-11-07 NOTE — ED Notes (Signed)
 Pt was tired of waiting and was feeling better and decided to leave. Tried to convince her to stay to see the doctor but she refused. Moved to [facility].

## 2023-11-07 NOTE — ED Triage Notes (Signed)
 The pt was out with her dog tonight and she ran into a rose bush she has a  bush thorn in her  lt ring finger  painful

## 2024-04-10 ENCOUNTER — Ambulatory Visit: Attending: Internal Medicine

## 2024-04-10 DIAGNOSIS — Z111 Encounter for screening for respiratory tuberculosis: Secondary | ICD-10-CM | POA: Diagnosis not present

## 2024-04-10 DIAGNOSIS — Z23 Encounter for immunization: Secondary | ICD-10-CM | POA: Diagnosis not present

## 2024-04-13 ENCOUNTER — Ambulatory Visit: Attending: Internal Medicine

## 2024-04-13 LAB — TB SKIN TEST
Induration: 0 mm
TB Skin Test: NEGATIVE
